# Patient Record
Sex: Male | Born: 1940 | Hispanic: No | Marital: Married | Smoking: Former smoker
Health system: Southern US, Community
[De-identification: ages and names within clinical notes are randomized; demographics above are authoritative.]

## PROBLEM LIST (undated history)

## (undated) DIAGNOSIS — I459 Conduction disorder, unspecified: Secondary | ICD-10-CM

## (undated) DIAGNOSIS — I77819 Aortic ectasia, unspecified site: Secondary | ICD-10-CM

## (undated) DIAGNOSIS — E785 Hyperlipidemia, unspecified: Secondary | ICD-10-CM

## (undated) DIAGNOSIS — R011 Cardiac murmur, unspecified: Secondary | ICD-10-CM

## (undated) DIAGNOSIS — R195 Other fecal abnormalities: Secondary | ICD-10-CM

## (undated) DIAGNOSIS — I480 Paroxysmal atrial fibrillation: Secondary | ICD-10-CM

## (undated) DIAGNOSIS — I4891 Unspecified atrial fibrillation: Secondary | ICD-10-CM

## (undated) DIAGNOSIS — Z85828 Personal history of other malignant neoplasm of skin: Secondary | ICD-10-CM

## (undated) DIAGNOSIS — I499 Cardiac arrhythmia, unspecified: Secondary | ICD-10-CM

## (undated) DIAGNOSIS — I77810 Thoracic aortic ectasia: Secondary | ICD-10-CM

## (undated) DIAGNOSIS — I2510 Atherosclerotic heart disease of native coronary artery without angina pectoris: Secondary | ICD-10-CM

## (undated) HISTORY — DX: Thoracic aortic ectasia: I77.810

## (undated) HISTORY — PX: INSERT / REPLACE / REMOVE PACEMAKER: SUR710

## (undated) HISTORY — DX: Atherosclerotic heart disease of native coronary artery without angina pectoris: I25.10

## (undated) HISTORY — PX: OTHER SURGICAL HISTORY: SHX169

## (undated) HISTORY — DX: Paroxysmal atrial fibrillation: I48.0

## (undated) HISTORY — DX: Conduction disorder, unspecified: I45.9

## (undated) HISTORY — DX: Aortic ectasia, unspecified site: I77.819

## (undated) HISTORY — DX: Hyperlipidemia, unspecified: E78.5

## (undated) HISTORY — DX: Personal history of other malignant neoplasm of skin: Z85.828

---

## 2018-05-26 DIAGNOSIS — Z85828 Personal history of other malignant neoplasm of skin: Secondary | ICD-10-CM

## 2018-05-26 HISTORY — DX: Personal history of other malignant neoplasm of skin: Z85.828

## 2018-08-04 DIAGNOSIS — C4491 Basal cell carcinoma of skin, unspecified: Secondary | ICD-10-CM

## 2018-08-04 HISTORY — DX: Basal cell carcinoma of skin, unspecified: C44.91

## 2018-10-20 DIAGNOSIS — Z85828 Personal history of other malignant neoplasm of skin: Secondary | ICD-10-CM

## 2019-06-21 ENCOUNTER — Other Ambulatory Visit: Payer: Self-pay | Admitting: Family Medicine

## 2019-06-21 DIAGNOSIS — I714 Abdominal aortic aneurysm, without rupture, unspecified: Secondary | ICD-10-CM

## 2019-06-21 DIAGNOSIS — I723 Aneurysm of iliac artery: Secondary | ICD-10-CM

## 2019-06-27 ENCOUNTER — Other Ambulatory Visit: Payer: Self-pay

## 2019-06-27 ENCOUNTER — Ambulatory Visit
Admission: RE | Admit: 2019-06-27 | Discharge: 2019-06-27 | Disposition: A | Discharge status: Home / Self Care | Payer: Medicare Other | Source: Ambulatory Visit | Attending: Family Medicine | Admitting: Family Medicine

## 2019-06-27 DIAGNOSIS — I714 Abdominal aortic aneurysm, without rupture, unspecified: Secondary | ICD-10-CM

## 2019-06-27 DIAGNOSIS — I723 Aneurysm of iliac artery: Secondary | ICD-10-CM | POA: Diagnosis present

## 2020-02-23 ENCOUNTER — Encounter: Payer: Self-pay | Admitting: Dermatology

## 2020-02-23 ENCOUNTER — Other Ambulatory Visit: Payer: Self-pay

## 2020-02-23 ENCOUNTER — Ambulatory Visit (INDEPENDENT_AMBULATORY_CARE_PROVIDER_SITE_OTHER): Payer: Medicare Other | Admitting: Dermatology

## 2020-02-23 DIAGNOSIS — D229 Melanocytic nevi, unspecified: Secondary | ICD-10-CM | POA: Diagnosis not present

## 2020-02-23 DIAGNOSIS — L578 Other skin changes due to chronic exposure to nonionizing radiation: Secondary | ICD-10-CM

## 2020-02-23 DIAGNOSIS — L814 Other melanin hyperpigmentation: Secondary | ICD-10-CM

## 2020-02-23 DIAGNOSIS — Z85828 Personal history of other malignant neoplasm of skin: Secondary | ICD-10-CM

## 2020-02-23 DIAGNOSIS — D1800 Hemangioma unspecified site: Secondary | ICD-10-CM

## 2020-02-23 DIAGNOSIS — L570 Actinic keratosis: Secondary | ICD-10-CM

## 2020-02-23 DIAGNOSIS — L821 Other seborrheic keratosis: Secondary | ICD-10-CM

## 2020-02-23 DIAGNOSIS — Z1283 Encounter for screening for malignant neoplasm of skin: Secondary | ICD-10-CM | POA: Diagnosis not present

## 2020-02-23 NOTE — Progress Notes (Signed)
Follow-Up Visit   Subjective  Todd Lucas is a 79 y.o. male who presents for the following: full body skin exam and skin cancer screening  Patient presents today for full body skin exam and skin cancer screening, has a few areas of concern on his face that he would like to have evaluated today. 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  The following portions of the chart were reviewed this encounter and updated as appropriate:  Tobacco  Allergies  Meds  Problems  Med Hx  Surg Hx  Fam Hx      Review of Systems:  No other skin or systemic complaints except as noted in HPI or Assessment and Plan.  Objective  Well appearing patient in no apparent distress; mood and affect are within normal limits.  A full examination was performed including scalp, head, eyes, ears, nose, lips, neck, chest, axillae, abdomen, back, buttocks, bilateral upper extremities, bilateral lower extremities, hands, feet, fingers, toes, fingernails, and toenails. All findings within normal limits unless otherwise noted below.  Objective  Mid Forehead: Diffuse actinic damage on face including right forehead and right cheek   Assessment & Plan  Actinic skin damage Mid Forehead  Recommend photodynamic therapy for face, ears and side of neck. Reviewed expected reaction and treatment course.   Recommend daily broad spectrum sunscreen SPF 30+ to sun-exposed areas, reapply every 2 hours as needed. Call for new or changing lesions.  Recommend taking Heliocare sun protection supplement daily in sunny weather for additional sun protection. For maximum protection on the sunniest days, you can take up to 2 capsules of regular Heliocare OR take 1 capsule of Heliocare Ultra. For prolonged exposure (such as a full day in the sun), you can repeat your dose of the supplement 4 hours after your first dose. Heliocare can be purchased at [facility] or  at [email or web address].   Recommend Nicotinamide 500mg  twice per day to lower risk of non-melanoma skin cancer by approximately 25%.      Lentigines - Scattered tan macules - Discussed due to sun exposure - Benign, observe - Call for any changes  Seborrheic Keratoses - Stuck-on, waxy, tan-brown papules and plaques  - Discussed benign etiology and prognosis. - Observe - Call for any changes  Melanocytic Nevi - Tan-brown and/or pink-flesh-colored symmetric macules and papules - Benign appearing on exam today - Observation - Call clinic for new or changing moles - Recommend daily use of broad spectrum spf 30+ sunscreen to sun-exposed areas.   Hemangiomas - Red papules - Discussed benign nature - Observe - Call for any changes  History of Basal Cell Carcinoma of the Skin - No evidence of recurrence today - Recommend regular full body skin exams - Recommend daily broad spectrum sunscreen SPF 30+ to sun-exposed areas, reapply every 2 hours as needed.  - Call if any new or changing lesions are noted between office visits  History of Squamous Cell Carcinoma of the Skin - No evidence of recurrence today - No lymphadenopathy - Recommend regular full body skin exams - Recommend daily broad spectrum sunscreen SPF 30+ to sun-exposed areas, reapply every 2 hours as needed.  - Call if any new or changing lesions are noted between office visits  Skin cancer screening performed today.   Return for [facility] light treatment late Sept.,  F/U Dr. Laurence Ferrari in Late October, 6 months TBSE.  I, Donzetta Kohut, CMA, am acting as scribe  for Forest Gleason, MD .  Documentation: I have reviewed the above documentation for accuracy and completeness, and I agree with the above.  Forest Gleason, MD

## 2020-02-23 NOTE — Patient Instructions (Addendum)
Recommend daily broad spectrum sunscreen SPF 30+ to sun-exposed areas, reapply every 2 hours as needed. Call for new or changing lesions.  Recommend taking Heliocare sun protection supplement daily in sunny weather for additional sun protection. For maximum protection on the sunniest days, you can take up to 2 capsules of regular Heliocare OR take 1 capsule of Heliocare Ultra. For prolonged exposure (such as a full day in the sun), you can repeat your dose of the supplement 4 hours after your first dose. Heliocare can be purchased at [facility] or at [email or web address].   Recommend Nicotinamide 500mg  twice per day to lower risk of non-melanoma skin cancer by approximately 25%.   Can also take Heliocare Advanced which contains both Heliocare and nicotinamide together

## 2020-03-12 ENCOUNTER — Encounter: Payer: Self-pay | Admitting: Dermatology

## 2020-04-09 ENCOUNTER — Ambulatory Visit (INDEPENDENT_AMBULATORY_CARE_PROVIDER_SITE_OTHER): Payer: Medicare Other

## 2020-04-09 ENCOUNTER — Other Ambulatory Visit: Payer: Self-pay

## 2020-04-09 DIAGNOSIS — L570 Actinic keratosis: Secondary | ICD-10-CM | POA: Diagnosis not present

## 2020-04-09 MED ORDER — AMINOLEVULINIC ACID HCL 20 % EX SOLR
1.0000 "application " | Freq: Once | CUTANEOUS | Status: AC
  Administered 2020-04-09: 354 mg via TOPICAL

## 2020-04-09 NOTE — Patient Instructions (Signed)

## 2020-04-09 NOTE — Progress Notes (Signed)
Patient completed PDT therapy today.  1. AK (actinic keratosis) face, ears and neck  Photodynamic therapy - face, ears and neck Procedure discussed: discussed risks, benefits, side effects. and alternatives   Prep: site scrubbed/prepped with acetone   Location:  Face, ears and neck Number of lesions:  Multiple Type of treatment:  Blue light Aminolevulinic Acid (see MAR for details): Levulan Number of Levulan sticks used:  1 Incubation time (minutes):  60 Number of minutes under lamp:  16 Number of seconds under lamp:  40 Cooling:  Floor fan Outcome: patient tolerated procedure well with no complications   Post-procedure details: sunscreen applied    Aminolevulinic Acid HCl 20 % SOLR 354 mg - face, ears and neck

## 2020-04-10 ENCOUNTER — Ambulatory Visit: Payer: PRIVATE HEALTH INSURANCE

## 2020-05-09 ENCOUNTER — Other Ambulatory Visit: Payer: Self-pay

## 2020-05-09 ENCOUNTER — Ambulatory Visit (INDEPENDENT_AMBULATORY_CARE_PROVIDER_SITE_OTHER): Payer: Medicare Other | Admitting: Dermatology

## 2020-05-09 ENCOUNTER — Encounter: Payer: Self-pay | Admitting: Dermatology

## 2020-05-09 DIAGNOSIS — L578 Other skin changes due to chronic exposure to nonionizing radiation: Secondary | ICD-10-CM

## 2020-05-09 DIAGNOSIS — L570 Actinic keratosis: Secondary | ICD-10-CM

## 2020-05-09 NOTE — Progress Notes (Signed)
   Follow-Up Visit   Subjective  Todd Lucas is a 79 y.o. male who presents for the following: Follow-up (Pt is following up after PDT to face, neck and ears. ).  He notes significant improvement in the rough spots.  He tolerated the procedure well.  The following portions of the chart were reviewed this encounter and updated as appropriate: Tobacco  Allergies  Meds  Problems  Med Hx  Surg Hx  Fam Hx      Review of Systems: No other skin or systemic complaints except as noted in HPI or Assessment and Plan.   Objective  Well appearing patient in no apparent distress; mood and affect are within normal limits.  A focused examination was performed including face, neck, and ears. Relevant physical exam findings are noted in the Assessment and Plan.   Assessment & Plan  Actinic Damage - Chronic, not at goal - diffuse scaly erythematous macules with underlying dyspigmentation, significantly improved compared to prior but still not at goal - Recommend repeat course of PDT to face ears and neck.  We will plan in January. - Recommend daily broad spectrum sunscreen SPF 30+ to sun-exposed areas, reapply every 2 hours as needed.  - Call for new or changing lesions.  Return in about 3 months (around 08/09/2020) for PDT face, neck, and ears.   I, Harriett Sine, CMA, am acting as scribe for Forest Gleason, MD.  Documentation: I have reviewed the above documentation for accuracy and completeness, and I agree with the above.  Forest Gleason, MD

## 2020-06-01 IMAGING — US US AORTA SCREENING (MEDICARE)
1 series · 14 of 25 positions shown · non-contrast
Comparison: None.

CLINICAL DATA: Evaluate known abdominal aortic and bilateral iliac
artery aneurysms. Former smoker. History of hyperlipidemia.

EXAM:
ULTRASOUND OF ABDOMINAL AORTA
TECHNIQUE: Ultrasound examination of the abdominal aorta and proximal common
iliac arteries was performed to evaluate for aneurysm. Additional
color and Doppler images of the distal aorta were obtained to
document patency.

[Series 1: us aorta screening (medicare) · 14 of 36 slices shown]
[im 1/36]
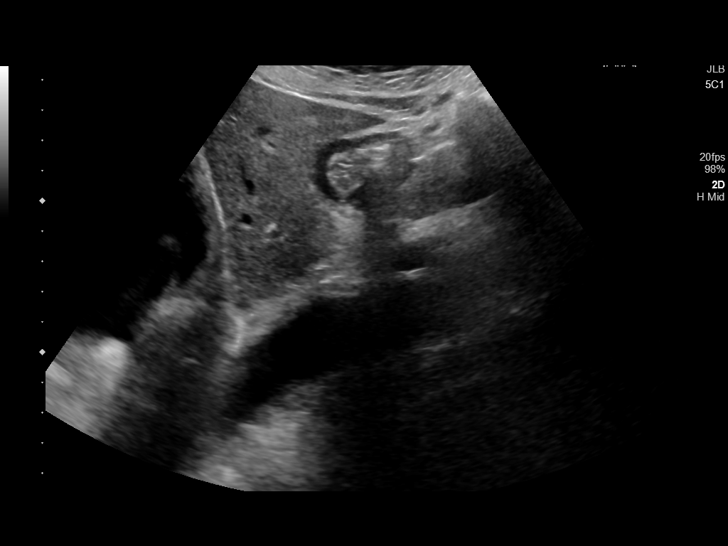
[im 3/36]
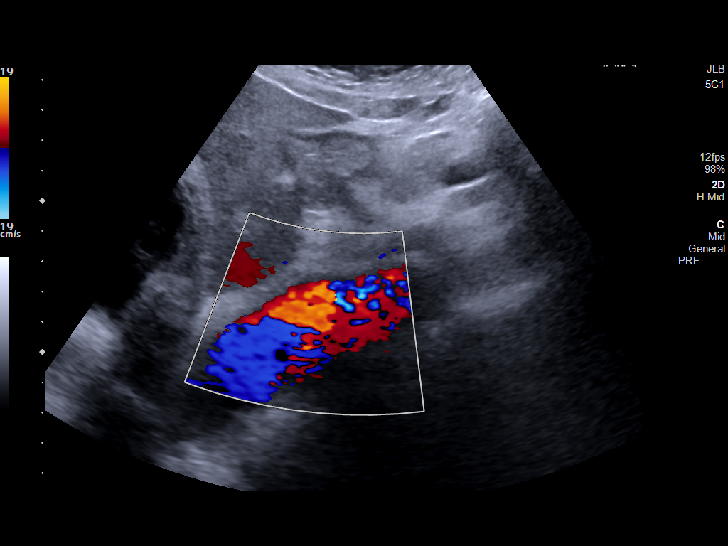
[im 6/36]
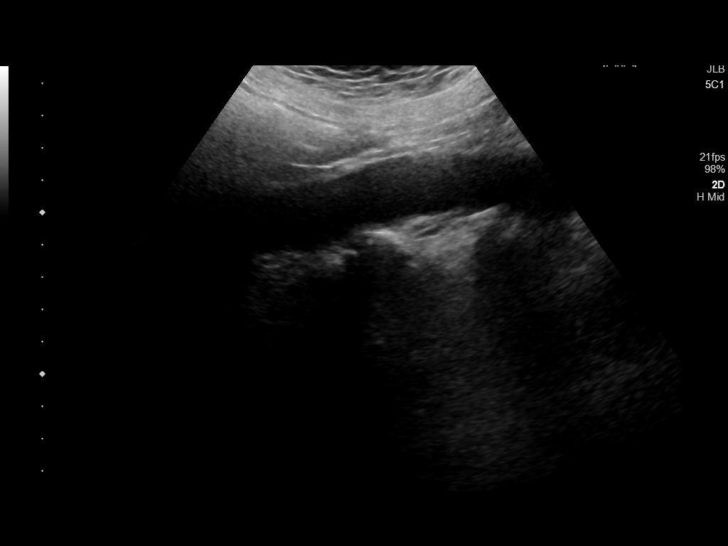
[im 9/36]
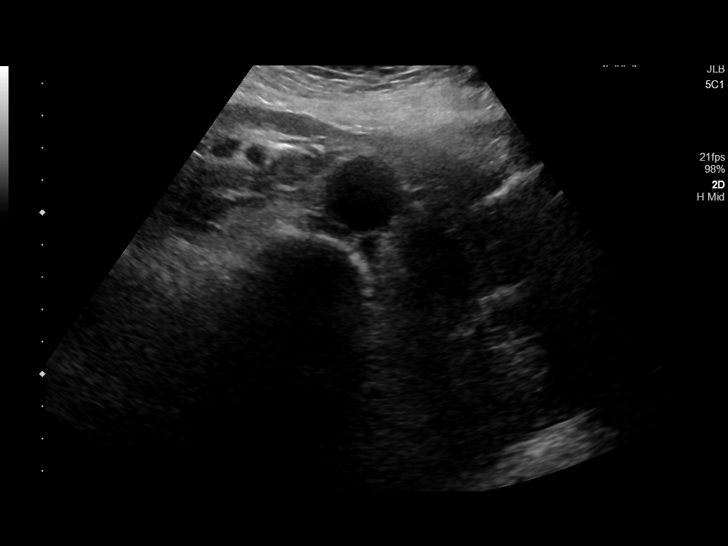
[im 12/36]
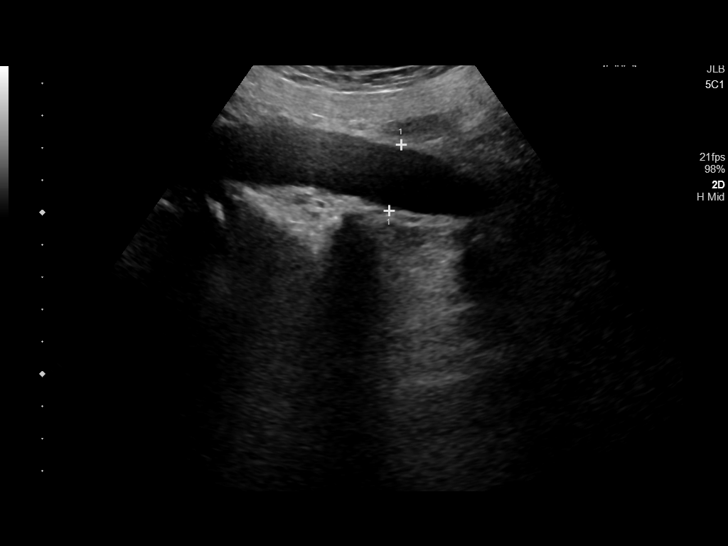
[im 14/36]
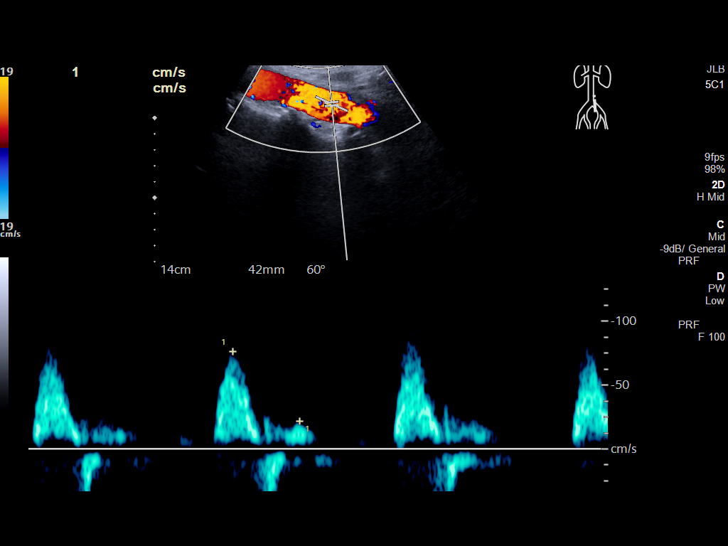
[im 17/36]
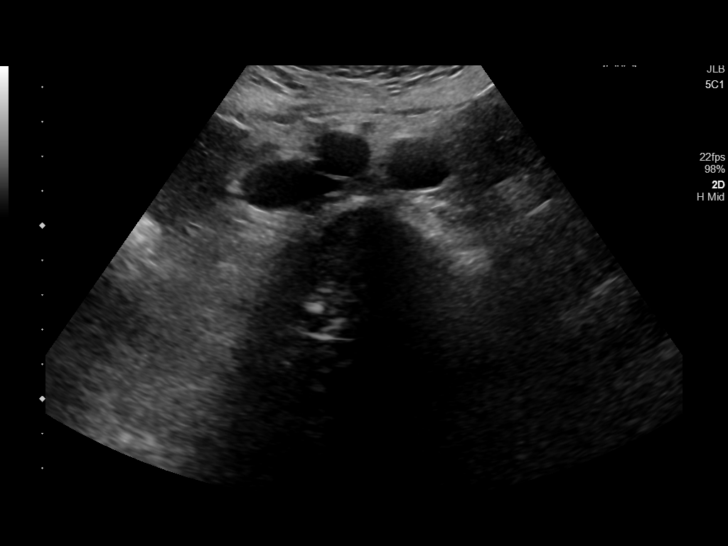
[im 19/36]
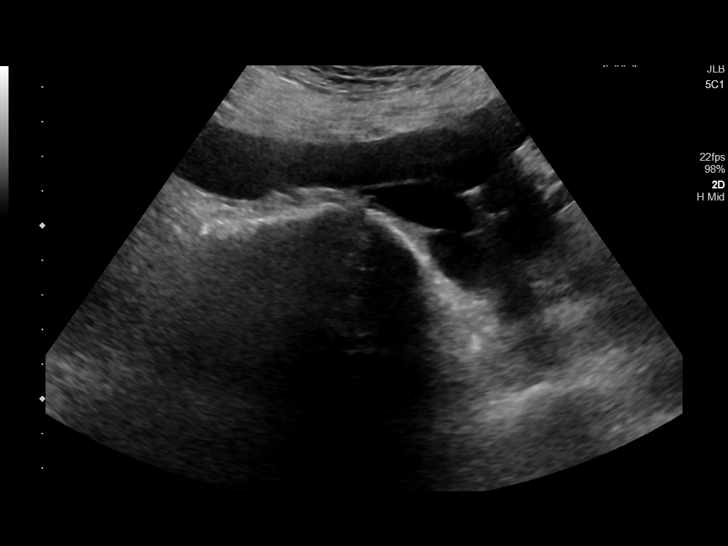
[im 22/36]
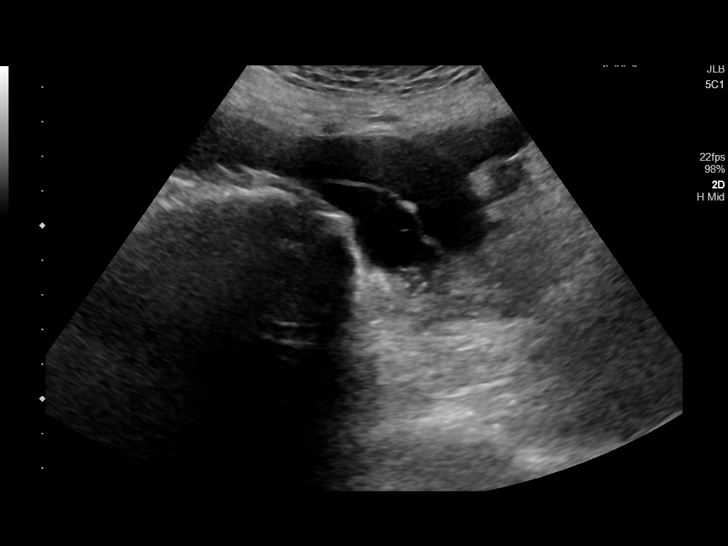
[im 24/36]
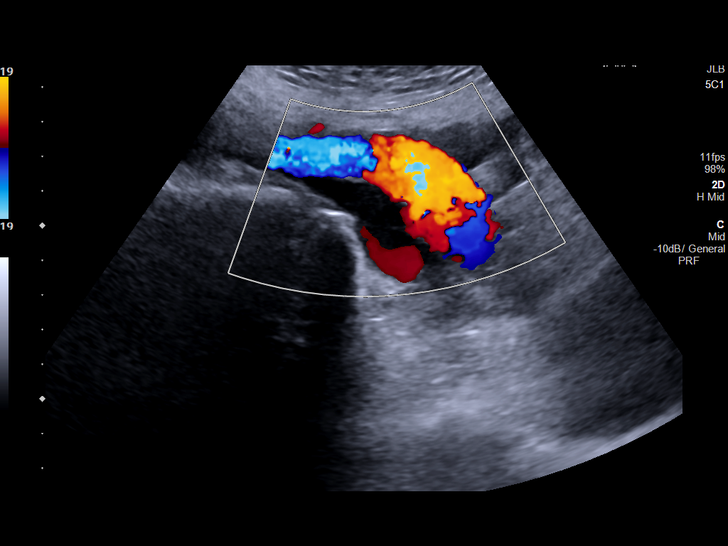
[im 27/36]
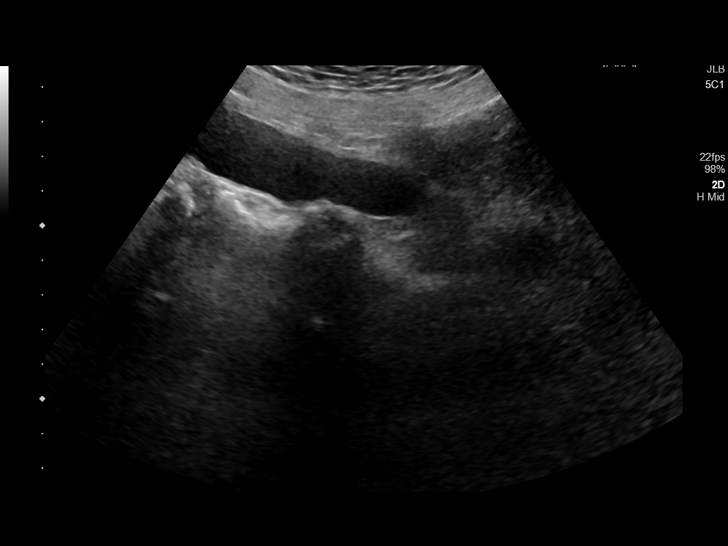
[im 30/36]
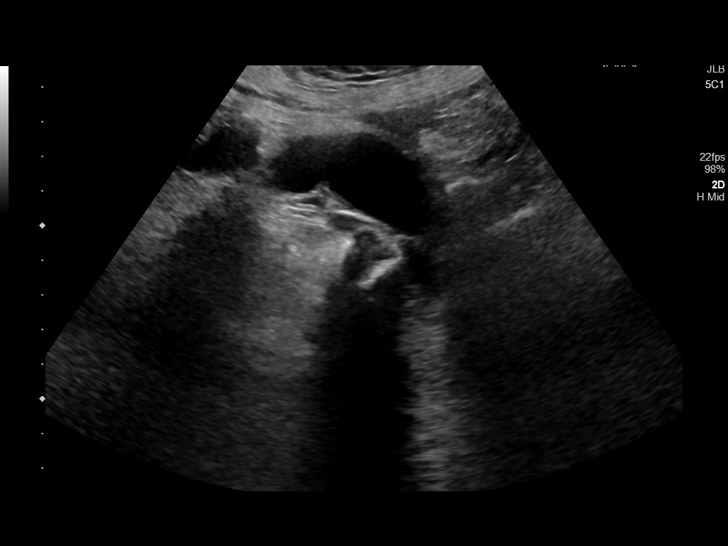
[im 33/36]
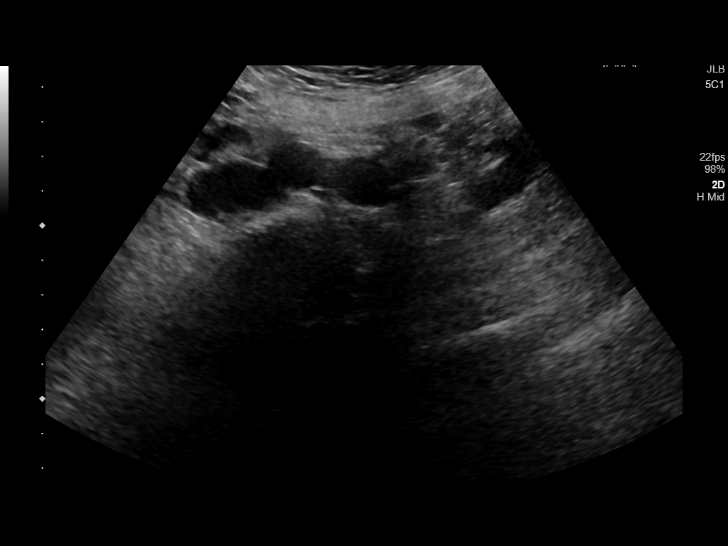
[im 36/36]
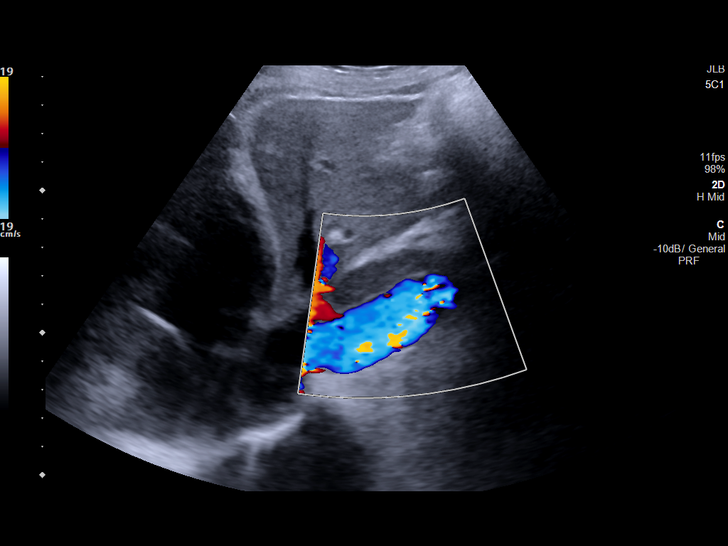

[14 of 25 positions shown; findings below may reference images not displayed]

FINDINGS: Abdominal aortic measurements as follows:

Proximal:  3.0 x 2.4 cm

Mid:  2.4 x 2.3 cm

Distal:  2.1 x 1.9 cm
Patent: Yes, peak systolic velocity is 76 cm/s

Right common iliac artery: 1.9 x 1.6 cm

Left common iliac artery: 2.1 x 1.7 cm
IMPRESSION: 1. No evidence of abdominal aortic aneurysm.
2. Mild ectasia of the bilateral common iliac arteries, left
slightly greater than right.

## 2020-06-14 ENCOUNTER — Other Ambulatory Visit: Payer: Self-pay

## 2020-06-14 ENCOUNTER — Ambulatory Visit (INDEPENDENT_AMBULATORY_CARE_PROVIDER_SITE_OTHER): Payer: Medicare Other

## 2020-06-14 DIAGNOSIS — L570 Actinic keratosis: Secondary | ICD-10-CM | POA: Diagnosis not present

## 2020-06-14 MED ORDER — AMINOLEVULINIC ACID HCL 20 % EX SOLR
1.0000 "application " | Freq: Once | CUTANEOUS | Status: AC
  Administered 2020-06-14: 354 mg via TOPICAL

## 2020-06-14 NOTE — Patient Instructions (Signed)

## 2020-06-14 NOTE — Progress Notes (Signed)
Patient completed PDT therapy today.  1. AK (actinic keratosis) face, neck and ears  Photodynamic therapy - face, neck and ears Procedure discussed: discussed risks, benefits, side effects. and alternatives   Prep: site scrubbed/prepped with acetone   Location:  Face, neck and ears Number of lesions:  Multiple Type of treatment:  Blue light Aminolevulinic Acid (see MAR for details): Levulan Number of Levulan sticks used:  1 Incubation time (minutes):  60 Number of minutes under lamp:  16 Number of seconds under lamp:  40 Cooling:  Floor fan Outcome: patient tolerated procedure well with no complications   Post-procedure details: sunscreen applied    Aminolevulinic Acid HCl 20 % SOLR 354 mg - face, neck and ears

## 2020-08-23 ENCOUNTER — Other Ambulatory Visit: Payer: Self-pay

## 2020-08-23 ENCOUNTER — Ambulatory Visit (INDEPENDENT_AMBULATORY_CARE_PROVIDER_SITE_OTHER): Payer: Medicare Other | Admitting: Dermatology

## 2020-08-23 DIAGNOSIS — L821 Other seborrheic keratosis: Secondary | ICD-10-CM | POA: Diagnosis not present

## 2020-08-23 DIAGNOSIS — Z1283 Encounter for screening for malignant neoplasm of skin: Secondary | ICD-10-CM | POA: Diagnosis not present

## 2020-08-23 DIAGNOSIS — Z85828 Personal history of other malignant neoplasm of skin: Secondary | ICD-10-CM | POA: Diagnosis not present

## 2020-08-23 DIAGNOSIS — L814 Other melanin hyperpigmentation: Secondary | ICD-10-CM | POA: Diagnosis not present

## 2020-08-23 DIAGNOSIS — L570 Actinic keratosis: Secondary | ICD-10-CM | POA: Diagnosis not present

## 2020-08-23 DIAGNOSIS — L578 Other skin changes due to chronic exposure to nonionizing radiation: Secondary | ICD-10-CM

## 2020-08-23 DIAGNOSIS — D229 Melanocytic nevi, unspecified: Secondary | ICD-10-CM

## 2020-08-23 DIAGNOSIS — D1800 Hemangioma unspecified site: Secondary | ICD-10-CM

## 2020-08-23 NOTE — Patient Instructions (Signed)
Cryotherapy Aftercare  . Wash gently with soap and water everyday.   Marland Kitchen Apply Vaseline and Band-Aid daily until healed.  Prior to procedure, discussed risks of blister formation, small wound, skin dyspigmentation, or rare scar following cryotherapy.   Melanoma ABCDEs  Melanoma is the most dangerous type of skin cancer, and is the leading cause of death from skin disease.  You are more likely to develop melanoma if you:  Have light-colored skin, light-colored eyes, or red or blond hair  Spend a lot of time in the sun  Tan regularly, either outdoors or in a tanning bed  Have had blistering sunburns, especially during childhood  Have a close family member who has had a melanoma  Have atypical moles or large birthmarks  Early detection of melanoma is key since treatment is typically straightforward and cure rates are extremely high if we catch it early.   The first sign of melanoma is often a change in a mole or a new dark spot.  The ABCDE system is a way of remembering the signs of melanoma.  A for asymmetry:  The two halves do not match. B for border:  The edges of the growth are irregular. C for color:  A mixture of colors are present instead of an even brown color. D for diameter:  Melanomas are usually (but not always) greater than 95mm - the size of a pencil eraser. E for evolution:  The spot keeps changing in size, shape, and color.  Please check your skin once per month between visits. You can use a small mirror in front and a large mirror behind you to keep an eye on the back side or your body.   If you see any new or changing lesions before your next follow-up, please call to schedule a visit.  Please continue daily skin protection including broad spectrum sunscreen SPF 30+ to sun-exposed areas, reapplying every 2 hours as needed when you're outdoors.

## 2020-08-23 NOTE — Progress Notes (Signed)
Follow-Up Visit   Subjective  Todd Lucas is a 80 y.o. male who presents for the following: TBSE (Patient here for full body skin exam and skin cancer screening. Patient with hx of SCC, BCC and AK's. Nothing new or changing that patient is aware of. )  Patient has had PDT x 2 recently and feels that there may be a few places that were missed.   Patient accompanied by wife.   The following portions of the chart were reviewed this encounter and updated as appropriate:   Tobacco  Allergies  Meds  Problems  Med Hx  Surg Hx  Fam Hx      Review of Systems:  No other skin or systemic complaints except as noted in HPI or Assessment and Plan.  Objective  Well appearing patient in no apparent distress; mood and affect are within normal limits.  A full examination was performed including scalp, head, eyes, ears, nose, lips, neck, chest, axillae, abdomen, back, buttocks, bilateral upper extremities, bilateral lower extremities, hands, feet, fingers, toes, fingernails, and toenails. All findings within normal limits unless otherwise noted below.  Objective  L vertex x 1, R frontal x 1, R vertex x 1, R zygoma x 1, R temple x 1, R medial cheek x 1, L cheek x 1, L temple x 1, L earlobe x 1, R pretibia x 1 (10): Erythematous thin papules/macules with gritty scale.    Assessment & Plan  AK (actinic keratosis) (10) L vertex x 1, R frontal x 1, R vertex x 1, R zygoma x 1, R temple x 1, R medial cheek x 1, L cheek x 1, L temple x 1, L earlobe x 1, R pretibia x 1  Prior to procedure, discussed risks of blister formation, small wound, skin dyspigmentation, or rare scar following cryotherapy.   Hypertrophic at scalp and right pretibia  Destruction of lesion - L vertex x 1, R frontal x 1, R vertex x 1, R zygoma x 1, R temple x 1, R medial cheek x 1, L cheek x 1, L temple x 1, L earlobe x 1, R pretibia x 1 Complexity: simple   Destruction method: cryotherapy   Informed consent: discussed and  consent obtained   Lesion destroyed using liquid nitrogen: Yes   Cryotherapy cycles:  2 Outcome: patient tolerated procedure well with no complications   Post-procedure details: wound care instructions given     Lentigines - Scattered tan macules - Discussed due to sun exposure - Benign, observe - Call for any changes  Seborrheic Keratoses - Stuck-on, waxy, tan-brown papules and plaques  - Discussed benign etiology and prognosis. - Observe - Call for any changes  Melanocytic Nevi - Tan-brown and/or pink-flesh-colored symmetric macules and papules - Benign appearing on exam today - Observation - Call clinic for new or changing moles - Recommend daily use of broad spectrum spf 30+ sunscreen to sun-exposed areas.   Hemangiomas - Red papules - Discussed benign nature - Observe - Call for any changes  Actinic Damage - Improved s/p PDT - Chronic, secondary to cumulative UV/sun exposure - diffuse scaly erythematous macules with underlying dyspigmentation - Recommend daily broad spectrum sunscreen SPF 30+ to sun-exposed areas, reapply every 2 hours as needed.  - Call for new or changing lesions.  Skin cancer screening performed today.  History of Basal Cell Carcinoma of the Skin - No evidence of recurrence today - Recommend regular full body skin exams - Recommend daily broad spectrum sunscreen SPF 30+ to  sun-exposed areas, reapply every 2 hours as needed.  - Call if any new or changing lesions are noted between office visits  History of Squamous Cell Carcinoma of the Skin - No evidence of recurrence today - No lymphadenopathy - Recommend regular full body skin exams - Recommend daily broad spectrum sunscreen SPF 30+ to sun-exposed areas, reapply every 2 hours as needed.  - Call if any new or changing lesions are noted between office visits  Return in about 3 months (around 11/20/2020) for AK follow up.  Graciella Belton, RMA, am acting as scribe for Forest Gleason, MD  .  Documentation: I have reviewed the above documentation for accuracy and completeness, and I agree with the above.  Forest Gleason, MD

## 2020-08-27 ENCOUNTER — Encounter: Payer: Self-pay | Admitting: Dermatology

## 2020-11-01 ENCOUNTER — Other Ambulatory Visit: Payer: Self-pay

## 2020-11-01 ENCOUNTER — Ambulatory Visit (INDEPENDENT_AMBULATORY_CARE_PROVIDER_SITE_OTHER): Payer: Medicare Other | Admitting: Dermatology

## 2020-11-01 DIAGNOSIS — L570 Actinic keratosis: Secondary | ICD-10-CM | POA: Diagnosis not present

## 2020-11-01 DIAGNOSIS — Z85828 Personal history of other malignant neoplasm of skin: Secondary | ICD-10-CM | POA: Diagnosis not present

## 2020-11-01 DIAGNOSIS — L578 Other skin changes due to chronic exposure to nonionizing radiation: Secondary | ICD-10-CM

## 2020-11-01 NOTE — Patient Instructions (Addendum)
Cryotherapy Aftercare  . Wash gently with soap and water everyday.   Marland Kitchen Apply Vaseline and Band-Aid daily until healed.  Prior to procedure, discussed risks of blister formation, small wound, skin dyspigmentation, or rare scar following cryotherapy.   Recommend taking Heliocare sun protection supplement daily in sunny weather for additional sun protection. For maximum protection on the sunniest days, you can take up to 2 capsules of regular Heliocare OR take 1 capsule of Heliocare Ultra. For prolonged exposure (such as a full day in the sun), you can repeat your dose of the supplement 4 hours after your first dose. Heliocare can be purchased at [facility] or at [email or web address].   Recommend daily broad spectrum sunscreen SPF 30+ to sun-exposed areas, reapply every 2 hours as needed. Call for new or changing lesions.  Staying in the shade or wearing long sleeves, sun glasses (UVA+UVB protection) and wide brim hats (4-inch brim around the entire circumference of the hat) are also recommended for sun protection.   If you have any questions or concerns for your doctor, please call our main line at [phone] and press option 4 to reach your doctor's medical assistant. If no one answers, please leave a voicemail as directed and we will return your call as soon as possible. Messages left after 4 pm will be answered the following business day.   You may also send [place] a message via [place]. We typically respond to MyChart messages within 1-2 business days.  For prescription refills, please ask your pharmacy to contact our office. Our fax number is [phone].  If you have an urgent issue when the clinic is closed that cannot wait until the next business day, you can page your doctor at the number below.    Please note that while we do our best to be available for urgent issues outside of office hours, we are not available 24/7.   If you have an urgent issue and are unable to reach [place], you  may choose to seek medical care at your doctor's office, retail clinic, urgent care center, or emergency room.  If you have a medical emergency, please immediately call 911 or go to the emergency department.  Pager Numbers  - Dr. Nehemiah Massed: [phone]  - Dr. Laurence Ferrari: [phone]  - Dr. Nicole Kindred: [phone]  In the event of inclement weather, please call our main line at [phone] for an update on the status of any delays or closures.  Dermatology Medication Tips: Please keep the boxes that topical medications come in in order to help keep track of the instructions about where and how to use these. Pharmacies typically print the medication instructions only on the boxes and not directly on the medication tubes.   If your medication is too expensive, please contact our office at [phone] option 4 or send [place] a message through [place].   We are unable to tell what your co-pay for medications will be in advance as this is different depending on your insurance coverage. However, we may be able to find a substitute medication at lower cost or fill out paperwork to get insurance to cover a needed medication.   If a prior authorization is required to get your medication covered by your insurance company, please allow [place] 1-2 business days to complete this process.  Drug prices often vary depending on where the prescription is filled and some pharmacies may offer cheaper prices.  The website www.goodrx.com contains coupons for medications through different pharmacies. The prices here do not  account for what the cost may be with help from insurance (it may be cheaper with your insurance), but the website can give you the price if you did not use any insurance.  - You can print the associated coupon and take it with your prescription to the pharmacy.  - You may also stop by our office during regular business hours and pick up a GoodRx coupon card.  - If you need your prescription sent  electronically to a different pharmacy, notify our office through [facility] or by phone at [phone] option 4.  Recommend Nicotinamide or Niacinamide 500mg  twice per day to lower risk of non-melanoma skin cancer by approximately 25%. Available at [employer].

## 2020-11-01 NOTE — Progress Notes (Signed)
   Follow-Up Visit   Subjective  Todd Lucas is a 80 y.o. male who presents for the following: Follow-up (Patient here today for 2 month AK follow up. Patient had PDT in September and December to face, neck and ears. Patient had 10 AK's treated with LN2 at last visit. He does have a scaly spot at right scalp and a rough spot at right cheek.).   Patient's TBSE was in February and he does have a hx of BCC and SCC.   The following portions of the chart were reviewed this encounter and updated as appropriate:   Tobacco  Allergies  Meds  Problems  Med Hx  Surg Hx  Fam Hx      Review of Systems:  No other skin or systemic complaints except as noted in HPI or Assessment and Plan.  Objective  Well appearing patient in no apparent distress; mood and affect are within normal limits.  A focused examination was performed including hands, arms, face, neck, ears and scalp. Relevant physical exam findings are noted in the Assessment and Plan.  Objective  Right Vertex Scalp x 1, right inf cheek medial to eyelid x 1, left nasal tip x 1, left lower lateral eyelid x 1 (4): Erythematous thin papules/macules with gritty scale.    Assessment & Plan  AK (actinic keratosis) (4) Right Vertex Scalp x 1, right inf cheek medial to eyelid x 1, left nasal tip x 1, left lower lateral eyelid x 1  Prior to procedure, discussed risks of blister formation, small wound, skin dyspigmentation, or rare scar following cryotherapy.   Hypertrophic at right vertex scalp and right inf cheek. Consider bx if not resolved at right vertex scalp.  Destruction of lesion - Right Vertex Scalp x 1, right inf cheek medial to eyelid x 1, left nasal tip x 1, left lower lateral eyelid x 1  Destruction method: cryotherapy   Informed consent: discussed and consent obtained   Lesion destroyed using liquid nitrogen: Yes   Cryotherapy cycles:  2 Outcome: patient tolerated procedure well with no complications   Post-procedure  details: wound care instructions given    Actinic Damage - chronic, secondary to cumulative UV radiation exposure/sun exposure over time - diffuse scaly erythematous macules with underlying dyspigmentation - Recommend daily broad spectrum sunscreen SPF 30+ to sun-exposed areas, reapply every 2 hours as needed.  - Recommend staying in the shade or wearing long sleeves, sun glasses (UVA+UVB protection) and wide brim hats (4-inch brim around the entire circumference of the hat). - Call for new or changing lesions.  History of BCC and SCC - Recommend Nicotinamide 500mg  twice per day to lower risk of non-melanoma skin cancer by approximately 25%.  - Recommend taking Heliocare sun protection supplement daily in sunny weather for additional sun protection. For maximum protection on the sunniest days, you can take up to 2 capsules of regular Heliocare OR take 1 capsule of Heliocare Ultra. For prolonged exposure (such as a full day in the sun), you can repeat your dose of the supplement 4 hours after your first dose.   Return in about 2 months (around 01/01/2021) for AK follow up, 4 month TBSE.  Graciella Belton, RMA, am acting as scribe for Forest Gleason, MD .  Documentation: I have reviewed the above documentation for accuracy and completeness, and I agree with the above.  Forest Gleason, MD

## 2020-11-05 ENCOUNTER — Encounter: Payer: Self-pay | Admitting: Dermatology

## 2021-01-09 ENCOUNTER — Encounter: Payer: Self-pay | Admitting: Dermatology

## 2021-01-09 ENCOUNTER — Other Ambulatory Visit: Payer: Self-pay

## 2021-01-09 ENCOUNTER — Ambulatory Visit (INDEPENDENT_AMBULATORY_CARE_PROVIDER_SITE_OTHER): Payer: Medicare Other | Admitting: Dermatology

## 2021-01-09 DIAGNOSIS — L821 Other seborrheic keratosis: Secondary | ICD-10-CM

## 2021-01-09 DIAGNOSIS — L578 Other skin changes due to chronic exposure to nonionizing radiation: Secondary | ICD-10-CM | POA: Diagnosis not present

## 2021-01-09 DIAGNOSIS — L570 Actinic keratosis: Secondary | ICD-10-CM

## 2021-01-09 NOTE — Progress Notes (Signed)
Follow-Up Visit   Subjective  Todd Lucas is a 80 y.o. male who presents for the following: Follow-up (Patient here today for 2 month AK follow up. Patient thinks spots have resolved and is not aware of any new or changing spots. ).  Areas treated at last visit with LN2 at Right Vertex Scalp x 1, right inf cheek medial to eyelid x 1, left nasal tip x 1, left lower lateral eyelid x 1  The following portions of the chart were reviewed this encounter and updated as appropriate:   Tobacco  Allergies  Meds  Problems  Med Hx  Surg Hx  Fam Hx       Review of Systems:  No other skin or systemic complaints except as noted in HPI or Assessment and Plan.  Objective  Well appearing patient in no apparent distress; mood and affect are within normal limits.  A focused examination was performed including face, neck and scalp. Relevant physical exam findings are noted in the Assessment and Plan.  left cheek x 2, chin x 1, right temple x 1, left postauricular neck x 1, left nasal sidewall x 1, right cheek x 3, right zygoma x 1 (10) Erythematous thin papules/macules with gritty scale.    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 Recommend Vaseline ointment to treated areas while healing.  Actinic keratoses are precancerous spots that appear secondary to cumulative UV radiation exposure/sun exposure over time. They are chronic with expected duration over 1 year. A portion of actinic keratoses will progress to squamous cell carcinoma of the skin. It is not possible to reliably predict which spots will progress to skin cancer and so treatment is recommended to prevent development of skin cancer.  Recommend daily broad spectrum  sunscreen SPF 30+ to sun-exposed areas, reapply every 2 hours as needed.  Recommend staying in the shade or wearing long sleeves, sun glasses (UVA+UVB protection) and wide brim hats (4-inch brim around the entire circumference of the hat). Call for new or changing lesions.    Destruction of lesion - left cheek x 2, chin x 1, right temple x 1, left postauricular neck x 1, left nasal sidewall x 1, right cheek x 3, right zygoma x 1  Destruction method: cryotherapy   Informed consent: discussed and consent obtained   Lesion destroyed using liquid nitrogen: Yes   Cryotherapy cycles:  2 Outcome: patient tolerated procedure well with no complications   Post-procedure details: wound care instructions given    Actinic Damage - chronic, secondary to cumulative UV radiation exposure/sun exposure over time - diffuse scaly erythematous macules with underlying dyspigmentation - Recommend daily broad spectrum sunscreen SPF 30+ to sun-exposed areas, reapply every 2 hours as needed.  - Recommend staying in the shade or wearing long sleeves, sun glasses (UVA+UVB protection) and wide brim hats (4-inch brim around the entire circumference of the hat). - Call for new or changing lesions. 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 - Observe - Call for any changes    Return for TBSE, as scheduled.  Graciella Belton, RMA, am acting as scribe  for Forest Gleason, MD .  Documentation: I have reviewed the above documentation for accuracy and completeness, and I agree with the above.  Forest Gleason, MD

## 2021-01-09 NOTE — Patient Instructions (Addendum)
Cryotherapy Aftercare  Wash gently with soap and water everyday.   Apply Vaseline and Band-Aid daily until healed.   Prior to procedure, discussed risks of blister formation, small wound, skin dyspigmentation, or rare scar following cryotherapy. Recommend Vaseline ointment to treated areas while healing.  Recommend daily broad spectrum sunscreen SPF 30+ to sun-exposed areas, reapply every 2 hours as needed. Call for new or changing lesions.  Staying in the shade or wearing long sleeves, sun glasses (UVA+UVB protection) and wide brim hats (4-inch brim around the entire circumference of the hat) are also recommended for sun protection.   Recommend taking Heliocare sun protection supplement daily in sunny weather for additional sun protection. For maximum protection on the sunniest days, you can take up to 2 capsules of regular Heliocare OR take 1 capsule of Heliocare Ultra. For prolonged exposure (such as a full day in the sun), you can repeat your dose of the supplement 4 hours after your first dose. Heliocare can be purchased at [facility] or at [email or web address].    If you have any questions or concerns for your doctor, please call our main line at [phone] and press option 4 to reach your doctor's medical assistant. If no one answers, please leave a voicemail as directed and we will return your call as soon as possible. Messages left after 4 pm will be answered the following business day.   You may also send us a message via MyChart. We typically respond to MyChart messages within 1-2 business days.  For prescription refills, please ask your pharmacy to contact our office. Our fax number is [phone].  If you have an urgent issue when the clinic is closed that cannot wait until the next business day, you can page your doctor at the number below.    Please note that while we do our best to be available for urgent issues outside of office hours, we are not available 24/7.   If  you have an urgent issue and are unable to reach us, you may choose to seek medical care at your doctor's office, retail clinic, urgent care center, or emergency room.  If you have a medical emergency, please immediately call 911 or go to the emergency department.  Pager Numbers  - Dr. Kowalski: [phone]  - Dr. Moye: [phone]  - Dr. Stewart: [phone]  In the event of inclement weather, please call our main line at [phone] for an update on the status of any delays or closures.  Dermatology Medication Tips: Please keep the boxes that topical medications come in in order to help keep track of the instructions about where and how to use these. Pharmacies typically print the medication instructions only on the boxes and not directly on the medication tubes.   If your medication is too expensive, please contact our office at [phone] option 4 or send us a message through MyChart.   We are unable to tell what your co-pay for medications will be in advance as this is different depending on your insurance coverage. However, we may be able to find a substitute medication at lower cost or fill out paperwork to get insurance to cover a needed medication.   If a prior authorization is required to get your medication covered by your insurance company, please allow us 1-2 business days to complete this process.  Drug prices often vary depending on where the prescription is filled and some pharmacies may offer cheaper prices.  The website www.goodrx.com contains coupons for medications through   different pharmacies. The prices here do not account for what the cost may be with help from insurance (it may be cheaper with your insurance), but the website can give you the price if you did not use any insurance.  - You can print the associated coupon and take it with your prescription to the pharmacy.  - You may also stop by our office during regular business hours and pick up a GoodRx  coupon card.  - If you need your prescription sent electronically to a different pharmacy, notify our office through [facility] MyChart or by phone at [phone] option 4.  

## 2021-02-21 ENCOUNTER — Ambulatory Visit: Payer: PRIVATE HEALTH INSURANCE | Admitting: Dermatology

## 2021-04-11 ENCOUNTER — Encounter: Payer: Self-pay | Admitting: Dermatology

## 2021-04-11 ENCOUNTER — Ambulatory Visit (INDEPENDENT_AMBULATORY_CARE_PROVIDER_SITE_OTHER): Payer: Medicare Other | Admitting: Dermatology

## 2021-04-11 ENCOUNTER — Ambulatory Visit (INDEPENDENT_AMBULATORY_CARE_PROVIDER_SITE_OTHER): Payer: Medicare Other

## 2021-04-11 ENCOUNTER — Other Ambulatory Visit: Payer: Self-pay

## 2021-04-11 DIAGNOSIS — L578 Other skin changes due to chronic exposure to nonionizing radiation: Secondary | ICD-10-CM | POA: Diagnosis not present

## 2021-04-11 DIAGNOSIS — D1800 Hemangioma unspecified site: Secondary | ICD-10-CM

## 2021-04-11 DIAGNOSIS — L570 Actinic keratosis: Secondary | ICD-10-CM | POA: Diagnosis not present

## 2021-04-11 DIAGNOSIS — Z85828 Personal history of other malignant neoplasm of skin: Secondary | ICD-10-CM

## 2021-04-11 DIAGNOSIS — L821 Other seborrheic keratosis: Secondary | ICD-10-CM

## 2021-04-11 DIAGNOSIS — L814 Other melanin hyperpigmentation: Secondary | ICD-10-CM

## 2021-04-11 DIAGNOSIS — Z1283 Encounter for screening for malignant neoplasm of skin: Secondary | ICD-10-CM | POA: Diagnosis not present

## 2021-04-11 DIAGNOSIS — Z872 Personal history of diseases of the skin and subcutaneous tissue: Secondary | ICD-10-CM

## 2021-04-11 DIAGNOSIS — D229 Melanocytic nevi, unspecified: Secondary | ICD-10-CM

## 2021-04-11 MED ORDER — AMINOLEVULINIC ACID HCL 20 % EX SOLR
1.0000 "application " | Freq: Once | CUTANEOUS | Status: AC
  Administered 2021-04-11: 354 mg via TOPICAL

## 2021-04-11 NOTE — Progress Notes (Signed)
Patient completed PDT therapy today.  1. AK (actinic keratosis) Head - Anterior (Face)  Photodynamic therapy - Head - Anterior (Face) Procedure discussed: discussed risks, benefits, side effects. and alternatives   Prep: site scrubbed/prepped with acetone   Location:  Face Number of lesions:  Multiple Type of treatment:  Blue light Aminolevulinic Acid (see MAR for details): Levulan Number of Levulan sticks used:  1 Incubation time (minutes):  60 Number of minutes under lamp:  16 Number of seconds under lamp:  40 Cooling:  Floor fan Outcome: patient tolerated procedure well with no complications   Post-procedure details: sunscreen applied     

## 2021-04-11 NOTE — Progress Notes (Signed)
Follow-Up Visit   Subjective  Todd Lucas is a 80 y.o. male who presents for the following: FBSE (Patient here for full body skin exam and skin cancer screening. Patient with hx of SCC, BCC and AK's. He is not aware of any new or changing spots. ).   The following portions of the chart were reviewed this encounter and updated as appropriate:   Tobacco  Allergies  Meds  Problems  Med Hx  Surg Hx  Fam Hx      Review of Systems:  No other skin or systemic complaints except as noted in HPI or Assessment and Plan.  Objective  Well appearing patient in no apparent distress; mood and affect are within normal limits.  A full examination was performed including scalp, head, eyes, ears, nose, lips, neck, chest, axillae, abdomen, back, buttocks, bilateral upper extremities, bilateral lower extremities, hands, feet, fingers, toes, fingernails, and toenails. All findings within normal limits unless otherwise noted below.  left pretibia x 1, left neck x 1 (2) Erythematous thin papules/macules with gritty scale.      Assessment & Plan  AK (actinic keratosis) (2) left pretibia x 1, left neck x 1  Hypertrophic at left pretibia  Prior to procedure, discussed risks of blister formation, small wound, skin dyspigmentation, or rare scar following cryotherapy. Recommend Vaseline ointment to treated areas while healing.   Destruction of lesion - left pretibia x 1, left neck x 1  Destruction method: cryotherapy   Informed consent: discussed and consent obtained   Lesion destroyed using liquid nitrogen: Yes   Cryotherapy cycles:  2 Outcome: patient tolerated procedure well with no complications   Post-procedure details: wound care instructions given    Lentigines - Scattered tan macules - Due to sun exposure - Benign-appearing, observe - Recommend daily broad spectrum sunscreen SPF 30+ to sun-exposed areas, reapply every 2 hours as needed. - Call for any changes  Seborrheic  Keratoses - Stuck-on, waxy, tan-brown papules and/or plaques  - Benign-appearing - Discussed benign etiology and prognosis. - Observe - Call for any changes  Melanocytic Nevi - Tan-brown and/or pink-flesh-colored symmetric macules and papules - Benign appearing on exam today - Observation - Call clinic for new or changing moles - Recommend daily use of broad spectrum spf 30+ sunscreen to sun-exposed areas.   Hemangiomas - Red papules - Discussed benign nature - Observe - Call for any changes  Actinic Damage - Severe, confluent actinic changes with pre-cancerous actinic keratoses  - Severe, chronic, not at goal, secondary to cumulative UV radiation exposure over time - diffuse scaly erythematous macules and papules with underlying dyspigmentation - Discussed Prescription "Field Treatment" for Severe, Chronic Confluent Actinic Changes with Pre-Cancerous Actinic Keratoses at face and anterior ears Field treatment involves treatment of an entire area of skin that has confluent Actinic Changes (Sun/ Ultraviolet light damage) and PreCancerous Actinic Keratoses by method of PhotoDynamic Therapy (PDT) and/or prescription Topical Chemotherapy agents such as 5-fluorouracil, 5-fluorouracil/calcipotriene, and/or imiquimod.  The purpose is to decrease the number of clinically evident and subclinical PreCancerous lesions to prevent progression to development of skin cancer by chemically destroying early precancer changes that may or may not be visible.  It has been shown to reduce the risk of developing skin cancer in the treated area. As a result of treatment, redness, scaling, crusting, and open sores may occur during treatment course. One or more than one of these methods may be used and may have to be used several times to control, suppress  and eliminate the PreCancerous changes. Discussed treatment course, expected reaction, and possible side effects. - Recommend daily broad spectrum sunscreen SPF  30+ to sun-exposed areas, reapply every 2 hours as needed.  - Staying in the shade or wearing long sleeves, sun glasses (UVA+UVB protection) and wide brim hats (4-inch brim around the entire circumference of the hat) are also recommended. - Call for new or changing lesions. - repeat PDT to face   Skin cancer screening performed today.  History of Basal Cell Carcinoma of the Skin - No evidence of recurrence today - Recommend regular full body skin exams - Recommend daily broad spectrum sunscreen SPF 30+ to sun-exposed areas, reapply every 2 hours as needed.  - Call if any new or changing lesions are noted between office visits  History of Squamous Cell Carcinoma of the Skin - No evidence of recurrence today - No lymphadenopathy - Recommend regular full body skin exams - Recommend daily broad spectrum sunscreen SPF 30+ to sun-exposed areas, reapply every 2 hours as needed.  - Call if any new or changing lesions are noted between office visits  History of PreCancerous Actinic Keratosis  - site(s) of PreCancerous Actinic Keratosis clear today. - these may recur and new lesions may form requiring treatment to prevent transformation into skin cancer - observe for new or changing spots and contact [facility] for appointment if occur - photoprotection with sun protective clothing; sunglasses and broad spectrum sunscreen with SPF of at least 30 + and frequent self skin exams recommended - yearly exams by a dermatologist recommended for persons with history of PreCancerous Actinic Keratoses  Return for Dec/Jan for AK follow up, early March for FBSE.  Graciella Belton, RMA, am acting as scribe for Forest Gleason, MD .  Documentation: I have reviewed the above documentation for accuracy and completeness, and I agree with the above.  Forest Gleason, MD

## 2021-04-11 NOTE — Patient Instructions (Addendum)
Levulan/PDT Treatment Common Side Effects  - Burning/stinging, which may be severe and last up to 24-72 hours after your treatment  - Redness, swelling and/or peeling which may last up to 4 weeks  - Scaling/crusting which may last up to 2 weeks  - Sun sensitivity (you MUST avoid sun exposure for 48-72 hours after treatment)  Care Instructions  - Okay to wash with soap and water and shampoo as normal  - If needed, you can do a cold compress (ex. Ice packs) for comfort  - If okay with your Primary Doctor, you may use analgesics such as Tylenol every 4-6 hours, not to exceed recommended dose  - You may apply Cerave Healing Ointment, Vaseline or Aquaphor  - If you have a lot of swelling you may take a Benadryl to help with this (this may cause drowsiness)  Sun Precautions  - Wear a wide brim hat for the next week if outside  - Wear a sunblock with zinc or titanium dioxide at least SPF 50 daily   We will recheck you in 10-12 weeks. If any problems, please call the office and ask to speak with a nurse.  Cryotherapy Aftercare  Wash gently with soap and water everyday.   Apply Vaseline and Band-Aid daily until healed.   Prior to procedure, discussed risks of blister formation, small wound, skin dyspigmentation, or rare scar following cryotherapy. Recommend Vaseline ointment to treated areas while healing.  Melanoma ABCDEs  Melanoma is the most dangerous type of skin cancer, and is the leading cause of death from skin disease.  You are more likely to develop melanoma if you: Have light-colored skin, light-colored eyes, or red or blond hair Spend a lot of time in the sun Tan regularly, either outdoors or in a tanning bed Have had blistering sunburns, especially during childhood Have a close family member who has had a melanoma Have atypical moles or large birthmarks  Early detection of melanoma is key since treatment is typically straightforward and cure rates are extremely high  if we catch it early.   The first sign of melanoma is often a change in a mole or a new dark spot.  The ABCDE system is a way of remembering the signs of melanoma.  A for asymmetry:  The two halves do not match. B for border:  The edges of the growth are irregular. C for color:  A mixture of colors are present instead of an even brown color. D for diameter:  Melanomas are usually (but not always) greater than 38mm - the size of a pencil eraser. E for evolution:  The spot keeps changing in size, shape, and color.  Please check your skin once per month between visits. You can use a small mirror in front and a large mirror behind you to keep an eye on the back side or your body.   If you see any new or changing lesions before your next follow-up, please call to schedule a visit.  Please continue daily skin protection including broad spectrum sunscreen SPF 30+ to sun-exposed areas, reapplying every 2 hours as needed when you're outdoors.    If you have any questions or concerns for your doctor, please call our main line at [phone] and press option 4 to reach your doctor's medical assistant. If no one answers, please leave a voicemail as directed and we will return your call as soon as possible. Messages left after 4 pm will be answered the following business day.   You  may also send [place] a message via [place]. We typically respond to MyChart messages within 1-2 business days.  For prescription refills, please ask your pharmacy to contact our office. Our fax number is [phone].  If you have an urgent issue when the clinic is closed that cannot wait until the next business day, you can page your doctor at the number below.    Please note that while we do our best to be available for urgent issues outside of office hours, we are not available 24/7.   If you have an urgent issue and are unable to reach [place], you may choose to seek medical care at your doctor's office, retail clinic, urgent care  center, or emergency room.  If you have a medical emergency, please immediately call 911 or go to the emergency department.  Pager Numbers  - Dr. Nehemiah Massed: [phone]  - Dr. Laurence Ferrari: [phone]  - Dr. Nicole Kindred: [phone]  In the event of inclement weather, please call our main line at [phone] for an update on the status of any delays or closures.  Dermatology Medication Tips: Please keep the boxes that topical medications come in in order to help keep track of the instructions about where and how to use these. Pharmacies typically print the medication instructions only on the boxes and not directly on the medication tubes.   If your medication is too expensive, please contact our office at [phone] option 4 or send [place] a message through [place].   We are unable to tell what your co-pay for medications will be in advance as this is different depending on your insurance coverage. However, we may be able to find a substitute medication at lower cost or fill out paperwork to get insurance to cover a needed medication.   If a prior authorization is required to get your medication covered by your insurance company, please allow [place] 1-2 business days to complete this process.  Drug prices often vary depending on where the prescription is filled and some pharmacies may offer cheaper prices.  The website www.goodrx.com contains coupons for medications through different pharmacies. The prices here do not account for what the cost may be with help from insurance (it may be cheaper with your insurance), but the website can give you the price if you did not use any insurance.  - You can print the associated coupon and take it with your prescription to the pharmacy.  - You may also stop by our office during regular business hours and pick up a GoodRx coupon card.  - If you need your prescription sent electronically to a different pharmacy, notify our office through [facility] or by  phone at [phone] option 4.

## 2021-09-05 ENCOUNTER — Ambulatory Visit: Payer: PRIVATE HEALTH INSURANCE | Admitting: Dermatology

## 2021-09-17 ENCOUNTER — Telehealth: Payer: Self-pay

## 2021-09-17 NOTE — Telephone Encounter (Signed)
LVM for pt to call office to reschedule appt with Dr. Laurence Ferrari. ?Todd Lucas., RMA ?

## 2021-09-19 ENCOUNTER — Ambulatory Visit: Payer: PRIVATE HEALTH INSURANCE | Admitting: Dermatology

## 2021-09-27 ENCOUNTER — Encounter: Payer: Self-pay | Admitting: *Deleted

## 2021-09-30 ENCOUNTER — Encounter
Admission: RE | Disposition: A | Discharge status: Home / Self Care | Payer: Self-pay | Source: Home / Self Care | Attending: Gastroenterology

## 2021-09-30 ENCOUNTER — Ambulatory Visit: Payer: Medicare Other | Admitting: Certified Registered Nurse Anesthetist

## 2021-09-30 ENCOUNTER — Encounter: Payer: Self-pay | Admitting: *Deleted

## 2021-09-30 ENCOUNTER — Ambulatory Visit
Admission: RE | Admit: 2021-09-30 | Discharge: 2021-09-30 | Disposition: A | Discharge status: Home / Self Care | Payer: Medicare Other | Attending: Gastroenterology | Admitting: Gastroenterology

## 2021-09-30 ENCOUNTER — Other Ambulatory Visit: Payer: Self-pay

## 2021-09-30 DIAGNOSIS — Z85828 Personal history of other malignant neoplasm of skin: Secondary | ICD-10-CM | POA: Diagnosis not present

## 2021-09-30 DIAGNOSIS — K529 Noninfective gastroenteritis and colitis, unspecified: Secondary | ICD-10-CM | POA: Diagnosis present

## 2021-09-30 DIAGNOSIS — K640 First degree hemorrhoids: Secondary | ICD-10-CM | POA: Diagnosis not present

## 2021-09-30 DIAGNOSIS — I4891 Unspecified atrial fibrillation: Secondary | ICD-10-CM | POA: Diagnosis not present

## 2021-09-30 DIAGNOSIS — Z950 Presence of cardiac pacemaker: Secondary | ICD-10-CM | POA: Diagnosis not present

## 2021-09-30 DIAGNOSIS — Z87891 Personal history of nicotine dependence: Secondary | ICD-10-CM | POA: Diagnosis not present

## 2021-09-30 HISTORY — DX: Other fecal abnormalities: R19.5

## 2021-09-30 HISTORY — DX: Cardiac murmur, unspecified: R01.1

## 2021-09-30 HISTORY — DX: Cardiac arrhythmia, unspecified: I49.9

## 2021-09-30 HISTORY — DX: Unspecified atrial fibrillation: I48.91

## 2021-09-30 HISTORY — PX: COLONOSCOPY WITH PROPOFOL: SHX5780

## 2021-09-30 SURGERY — COLONOSCOPY WITH PROPOFOL
Anesthesia: General

## 2021-09-30 MED ORDER — PROPOFOL 10 MG/ML IV BOLUS
INTRAVENOUS | Status: DC | PRN
  Administered 2021-09-30: 20 mg via INTRAVENOUS
  Administered 2021-09-30: 30 mg via INTRAVENOUS
  Administered 2021-09-30: 70 mg via INTRAVENOUS

## 2021-09-30 MED ORDER — GLYCOPYRROLATE 0.2 MG/ML IJ SOLN
INTRAMUSCULAR | Status: AC
  Filled 2021-09-30: qty 1

## 2021-09-30 MED ORDER — SODIUM CHLORIDE 0.9 % IV SOLN: INTRAVENOUS | Status: DC

## 2021-09-30 MED ORDER — EPHEDRINE SULFATE (PRESSORS) 50 MG/ML IJ SOLN
INTRAMUSCULAR | Status: DC | PRN
Start: 2021-09-30 — End: 2021-09-30
  Administered 2021-09-30: 10 mg via INTRAVENOUS
  Administered 2021-09-30: 5 mg via INTRAVENOUS

## 2021-09-30 MED ORDER — PROPOFOL 500 MG/50ML IV EMUL
INTRAVENOUS | Status: DC | PRN
  Administered 2021-09-30: 160 ug/kg/min via INTRAVENOUS

## 2021-09-30 MED ORDER — GLYCOPYRROLATE 0.2 MG/ML IJ SOLN
INTRAMUSCULAR | Status: DC | PRN
  Administered 2021-09-30: .2 mg via INTRAVENOUS

## 2021-09-30 NOTE — Anesthesia Procedure Notes (Signed)
Date/Time: 09/30/2021 12:34 PM ?Performed by: Demetrius Charity, CRNA ?Pre-anesthesia Checklist: Patient identified, Emergency Drugs available, Suction available, Patient being monitored and Timeout performed ?Patient Re-evaluated:Patient Re-evaluated prior to induction ?Oxygen Delivery Method: Nasal cannula ?Induction Type: IV induction ?Placement Confirmation: CO2 detector and positive ETCO2 ? ? ? ? ?

## 2021-09-30 NOTE — Interval H&P Note (Signed)
History and Physical Interval Note: ? ?09/30/2021 ?12:28 PM ? ?Todd Lucas  has presented today for surgery, with the diagnosis of Chronic Diarrhea.  The various methods of treatment have been discussed with the patient and family. After consideration of risks, benefits and other options for treatment, the patient has consented to  Procedure(s): ?COLONOSCOPY WITH PROPOFOL (N/A) as a surgical intervention.  The patient's history has been reviewed, patient examined, no change in status, stable for surgery.  I have reviewed the patient's chart and labs.  Questions were answered to the patient's satisfaction.   ? ? ?Hilton Cork Mathilde Mcwherter ? ?Ok to proceed with colonoscopy ?

## 2021-09-30 NOTE — Op Note (Signed)
[facility] ?Gastroenterology ?Patient Name: Todd Lucas ?Procedure Date: 09/30/2021 12:22 PM ?MRN: [id number] ?Account #: [id number] ?Date of Birth: August 10, 1940 ?Admit Type: Outpatient ?Age: 81 ?Room: [facility] ENDO ROOM 3 ?Gender: Male ?Note Status: Finalized ?Instrument Name: Colonoscope [id number] ?Procedure:             Colonoscopy ?Indications:           Chronic diarrhea ?Providers:             Andrey Farmer MD, MD ?Medicines:             Monitored Anesthesia Care ?Complications:         No immediate complications. Estimated blood loss:  ?                       Minimal. ?Procedure:             Pre-Anesthesia Assessment: ?                       - Prior to the procedure, a History and Physical was  ?                       performed, and patient medications and allergies were  ?                       reviewed. The patient is competent. The risks and  ?                       benefits of the procedure and the sedation options and  ?                       risks were discussed with the patient. All questions  ?                       were answered and informed consent was obtained.  ?                       Patient identification and proposed procedure were  ?                       verified by the physician, the nurse, the  ?                       anesthesiologist, the anesthetist and the technician  ?                       in the endoscopy suite. Mental Status Examination:  ?                       alert and oriented. Airway Examination: normal  ?                       oropharyngeal airway and neck mobility. Respiratory  ?                       Examination: clear to auscultation. CV Examination:  ?                       normal. Prophylactic Antibiotics: The patient does not  ?  require prophylactic antibiotics. Prior  ?                       Anticoagulants: The patient has taken Xarelto  ?                       (rivaroxaban), last dose was 2 days prior to  ?                        procedure. ASA Grade Assessment: II - A patient with  ?                       mild systemic disease. After reviewing the risks and  ?                       benefits, the patient was deemed in satisfactory  ?                       condition to undergo the procedure. The anesthesia  ?                       plan was to use monitored anesthesia care (MAC).  ?                       Immediately prior to administration of medications,  ?                       the patient was re-assessed for adequacy to receive  ?                       sedatives. The heart rate, respiratory rate, oxygen  ?                       saturations, blood pressure, adequacy of pulmonary  ?                       ventilation, and response to care were monitored  ?                       throughout the procedure. The physical status of the  ?                       patient was re-assessed after the procedure. ?                       After obtaining informed consent, the colonoscope was  ?                       passed under direct vision. Throughout the procedure,  ?                       the patient's blood pressure, pulse, and oxygen  ?                       saturations were monitored continuously. The  ?                       Colonoscope was introduced through the anus and  ?  advanced to the the terminal ileum. The colonoscopy  ?                       was performed without difficulty. The patient  ?                       tolerated the procedure well. The quality of the bowel  ?                       preparation was adequate to identify polyps. ?Findings: ?     The perianal and digital rectal examinations were normal. ?     The terminal ileum appeared normal. ?     Normal mucosa was found in the entire colon. Biopsies for histology were  ?     taken with a cold forceps from the entire colon for evaluation of  ?     microscopic colitis. Estimated blood loss was minimal. ?     Internal hemorrhoids were found during retroflexion. The  hemorrhoids  ?     were Grade I (internal hemorrhoids that do not prolapse). ?     The exam was otherwise without abnormality on direct and retroflexion  ?     views. ?Impression:            - The examined portion of the ileum was normal. ?                       - Normal mucosa in the entire examined colon. Biopsied. ?                       - Internal hemorrhoids. ?                       - The examination was otherwise normal on direct and  ?                       retroflexion views. ?Recommendation:        - Discharge patient to home. ?                       - Resume previous diet. ?                       - Continue present medications. ?                       - Await pathology results. ?                       - Repeat colonoscopy is not recommended due to current  ?                       age (33 years or older) for screening purposes. ?                       - Return to referring physician as previously  ?                       scheduled. ?Procedure Code(s):     --- Professional --- ?  42353, Colonoscopy, flexible; with biopsy, single or  ?                       multiple ?Diagnosis Code(s):     --- Professional --- ?                       K64.0, First degree hemorrhoids ?                       K52.9, Noninfective gastroenteritis and colitis,  ?                       unspecified ?CPT copyright 2019 American Medical Association. All rights reserved. ?The codes documented in this report are preliminary and upon coder review may  ?be revised to meet current compliance requirements. ?Andrey Farmer MD, MD ?09/30/2021 1:02:50 PM ?Number of Addenda: 0 ?Note Initiated On: 09/30/2021 12:22 PM ?Scope Withdrawal Time: 0 hours 11 minutes 56 seconds  ?Total Procedure Duration: 0 hours 19 minutes 42 seconds  ?Estimated Blood Loss:  Estimated blood loss was minimal. ?     [facility] ?

## 2021-09-30 NOTE — Transfer of Care (Signed)
Immediate Anesthesia Transfer of Care Note ? ?Patient: Todd Lucas ? ?Procedure(s) Performed: COLONOSCOPY WITH PROPOFOL ? ?Patient Location: PACU ? ?Anesthesia Type:General ? ?Level of Consciousness: awake and alert  ? ?Airway & Oxygen Therapy: Patient Spontanous Breathing and Patient connected to nasal cannula oxygen ? ?Post-op Assessment: Report given to RN and Post -op Vital signs reviewed and stable ? ?Post vital signs: Reviewed and stable ? ?Last Vitals:  ?Vitals Value Taken Time  ?BP    ?Temp    ?Pulse    ?Resp    ?SpO2    ? ? ?Last Pain:  ?Vitals:  ? 09/30/21 1203  ?TempSrc: Temporal  ?PainSc: 0-No pain  ?   ? ?  ? ?Complications: No notable events documented. ?

## 2021-09-30 NOTE — Anesthesia Preprocedure Evaluation (Signed)
Anesthesia Evaluation  ?Patient identified by MRN, date of birth, ID band ?Patient awake ? ? ? ?Reviewed: ?Allergy & Precautions, NPO status , Patient's Chart, lab work & pertinent test results ? ?Airway ?Mallampati: II ? ?TM Distance: >3 FB ?Neck ROM: full ? ? ? Dental ? ?(+) Chipped ?  ?Pulmonary ?neg shortness of breath, former smoker,  ?  ?Pulmonary exam normal ? ? ? ? ? ? ? Cardiovascular ?Exercise Tolerance: Good ?(-) angina(-) DOE + dysrhythmias Atrial Fibrillation  ? ? ?  ?Neuro/Psych ?negative neurological ROS ? negative psych ROS  ? GI/Hepatic ?negative GI ROS, Neg liver ROS, neg GERD  ,  ?Endo/Other  ?negative endocrine ROS ? Renal/GU ?negative Renal ROS  ?negative genitourinary ?  ?Musculoskeletal ? ? Abdominal ?  ?Peds ? Hematology ?negative hematology ROS ?(+)   ?Anesthesia Other Findings ?Past Medical History: ?No date: Atrial fibrillation ([place]) ?08/04/2018: Basal cell carcinoma ?    Comment:  right upper arm  ?No date: Dysrhythmia ?No date: Heart murmur ?05/26/2018: Hx of basal cell carcinoma ?    Comment:  R upper arm. Excised 07/15/2018, margins free. ?05/26/2018: Hx of squamous cell carcinoma of skin ?    Comment:  L lateral calf ?No date: Loose stools ? ?Past Surgical History: ?No date: aneurysm artery iliac common ?No date: heart ablation ?No date: INSERT / REPLACE / REMOVE PACEMAKER ? ?BMI   ? Body Mass Index: 21.11 kg/m?  ?  ? ? Reproductive/Obstetrics ?negative OB ROS ? ?  ? ? ? ? ? ? ? ? ? ? ? ? ? ?  ?  ? ? ? ? ? ? ? ? ?Anesthesia Physical ?Anesthesia Plan ? ?ASA: 3 ? ?Anesthesia Plan: General  ? ?Post-op Pain Management:   ? ?Induction: Intravenous ? ?PONV Risk Score and Plan: Propofol infusion and TIVA ? ?Airway Management Planned: Natural Airway and Nasal Cannula ? ?Additional Equipment:  ? ?Intra-op Plan:  ? ?Post-operative Plan:  ? ?Informed Consent: I have reviewed the patients History and Physical, chart, labs and discussed the procedure including the  risks, benefits and alternatives for the proposed anesthesia with the patient or authorized representative who has indicated his/her understanding and acceptance.  ? ? ? ?Dental Advisory Given ? ?Plan Discussed with: Anesthesiologist, CRNA and Surgeon ? ?Anesthesia Plan Comments: (Patient consented for risks of anesthesia including but not limited to:  ?- adverse reactions to medications ?- risk of airway placement if required ?- damage to eyes, teeth, lips or other oral mucosa ?- nerve damage due to positioning  ?- sore throat or hoarseness ?- Damage to heart, brain, nerves, lungs, other parts of body or loss of life ? ?Patient voiced understanding.)  ? ? ? ? ? ? ?Anesthesia Quick Evaluation ? ?

## 2021-09-30 NOTE — H&P (Signed)
Outpatient short stay form Pre-procedure ?09/30/2021  ?Lesly Rubenstein, MD ? ?Primary Physician: Maryland Pink, MD ? ?Reason for visit:  Chronic diarrhea ? ?History of present illness:   ? ?81 y/o gentleman with history of bicuspid aortic valve, a. Fib on DOAC with last dose 48 hours ago, and pacemaker here for colonoscopy for chronic diarrhea. Last colonoscopy was in 2014 in [place]. History of inguinal hernia repairs. No family history of GI malignancies. ? ? ? ?Current Facility-Administered Medications:  ?  0.9 %  sodium chloride infusion, , Intravenous, Continuous, Znya Albino, Hilton Cork, MD, Last Rate: 20 mL/hr at 09/30/21 1220, Continued from Pre-op at 09/30/21 1220 ? ?Medications Prior to Admission  ?Medication Sig Dispense Refill Last Dose  ? apixaban (ELIQUIS) 5 MG TABS tablet Take 5 mg by mouth 2 (two) times daily.   09/27/2021 at 1700  ? cetirizine (ZYRTEC) 10 MG tablet Take 10 mg by mouth daily.     ? diltiazem (CARDIZEM) 30 MG tablet Take 30 mg by mouth 3 (three) times daily.   Past Month  ? levothyroxine (SYNTHROID, LEVOTHROID) 75 MCG tablet Take 88 mcg by mouth daily before breakfast.   09/30/2021  ? rivaroxaban (XARELTO) 10 MG TABS tablet Take 10 mg by mouth daily.   09/27/2021  ? rosuvastatin (CRESTOR) 10 MG tablet Take 10 mg by mouth daily.   09/29/2021  ? zolpidem (AMBIEN) 10 MG tablet Take 10 mg by mouth at bedtime as needed for sleep.   Past Week  ? celecoxib (CELEBREX) 100 MG capsule Take 100 mg by mouth 2 (two) times daily. (Patient not taking: Reported on 09/30/2021)   Not Taking  ? gabapentin (NEURONTIN) 100 MG capsule Take by mouth.     ? ? ? ?No Known Allergies ? ? ?Past Medical History:  ?Diagnosis Date  ? Atrial fibrillation ([place])   ? Basal cell carcinoma 08/04/2018  ? right upper arm   ? Dysrhythmia   ? Heart murmur   ? Hx of basal cell carcinoma 05/26/2018  ? R upper arm. Excised 07/15/2018, margins free.  ? Hx of squamous cell carcinoma of skin 05/26/2018  ? L lateral calf  ? Loose stools    ? ? ?Review of systems:  Otherwise negative.  ? ? ?Physical Exam ? ?Gen: Alert, oriented. Appears stated age.  ?HEENT: PERRLA. ?Lungs: No respiratory distress ?CV: RRR ?Abd: soft, benign, no masses ?Ext: No edema ? ? ? ?Planned procedures: Proceed with colonoscopy. The patient understands the nature of the planned procedure, indications, risks, alternatives and potential complications including but not limited to bleeding, infection, perforation, damage to internal organs and possible oversedation/side effects from anesthesia. The patient agrees and gives consent to proceed.  ?Please refer to procedure notes for findings, recommendations and patient disposition/instructions.  ? ? ? ?Lesly Rubenstein, MD ?[facility] ? ? ? ?  ? ?

## 2021-09-30 NOTE — Anesthesia Postprocedure Evaluation (Signed)
Anesthesia Post Note ? ?Patient: Todd Lucas ? ?Procedure(s) Performed: COLONOSCOPY WITH PROPOFOL ? ?Patient location during evaluation: Endoscopy ?Anesthesia Type: General ?Level of consciousness: awake and alert ?Pain management: pain level controlled ?Vital Signs Assessment: post-procedure vital signs reviewed and stable ?Respiratory status: spontaneous breathing, nonlabored ventilation, respiratory function stable and patient connected to nasal cannula oxygen ?Cardiovascular status: blood pressure returned to baseline and stable ?Postop Assessment: no apparent nausea or vomiting ?Anesthetic complications: no ? ? ?No notable events documented. ? ? ?Last Vitals:  ?Vitals:  ? 09/30/21 1321 09/30/21 1331  ?BP: (!) 141/86 (!) 143/86  ?Pulse: 65   ?Resp: 13   ?Temp:    ?SpO2: 98%   ?  ?Last Pain:  ?Vitals:  ? 09/30/21 1331  ?TempSrc:   ?PainSc: 0-No pain  ? ? ?  ?  ?  ?  ?  ?  ? ?[name] ? ? ? ? ?

## 2021-10-01 ENCOUNTER — Encounter: Payer: Self-pay | Admitting: Gastroenterology

## 2021-10-01 LAB — SURGICAL PATHOLOGY

## 2021-12-10 ENCOUNTER — Ambulatory Visit: Payer: Medicare Other | Admitting: Physical Therapy

## 2021-12-12 ENCOUNTER — Encounter: Payer: Medicare Other | Admitting: Physical Therapy

## 2021-12-17 ENCOUNTER — Encounter: Payer: PRIVATE HEALTH INSURANCE | Admitting: Physical Therapy

## 2021-12-19 ENCOUNTER — Encounter: Payer: PRIVATE HEALTH INSURANCE | Admitting: Physical Therapy

## 2021-12-24 ENCOUNTER — Encounter: Payer: PRIVATE HEALTH INSURANCE | Admitting: Physical Therapy

## 2021-12-25 ENCOUNTER — Ambulatory Visit (INDEPENDENT_AMBULATORY_CARE_PROVIDER_SITE_OTHER): Payer: Medicare Other | Admitting: Cardiology

## 2021-12-25 ENCOUNTER — Encounter: Payer: Self-pay | Admitting: Cardiology

## 2021-12-25 VITALS — BP 132/80 | HR 64 | Ht 73.0 in | Wt 163.0 lb

## 2021-12-25 DIAGNOSIS — I480 Paroxysmal atrial fibrillation: Secondary | ICD-10-CM | POA: Diagnosis not present

## 2021-12-25 DIAGNOSIS — Z950 Presence of cardiac pacemaker: Secondary | ICD-10-CM

## 2021-12-25 NOTE — Progress Notes (Signed)
Electrophysiology Office Note:    Date:  12/25/2021   ID:  Todd Lucas, DOB 1940-08-22, MRN [id number]  PCP:  Maryland Pink, MD  [facility] Cardiologist:  None  [facility] Electrophysiologist:  Vickie Epley, MD   Referring MD: Maryland Pink, MD   Chief Complaint: Atrial fibrillation  History of Present Illness:    Todd Lucas is a 81 y.o. male who presents for an evaluation of atrial fibrillation at the request of Dr. Corky Sox. Their medical history includes bicuspid aortic valve, paroxysmal atrial fibrillation post ablation in 2014, AV block post dual-chamber permanent pacemaker, dilated aortic root, iliac aneurysms bilaterally, hypertension.  The patient was last seen by Dr. Corky Sox November 06, 2021.  He continues to have symptomatic episodes of atrial fibrillation by history and device interrogation.  He experiences lightheadedness, dyspnea and poor exercise tolerance when in atrial fibrillation.  The patient is referred to discuss antiarrhythmic drugs versus repeat ablation.       Past Medical History:  Diagnosis Date   Atrial fibrillation ([place])    Basal cell carcinoma 08/04/2018   right upper arm    Dysrhythmia    Heart murmur    Hx of basal cell carcinoma 05/26/2018   R upper arm. Excised 07/15/2018, margins free.   Hx of squamous cell carcinoma of skin 05/26/2018   L lateral calf   Loose stools     Past Surgical History:  Procedure Laterality Date   aneurysm artery iliac common     COLONOSCOPY WITH PROPOFOL N/A 09/30/2021   Procedure: COLONOSCOPY WITH PROPOFOL;  Surgeon: Lesly Rubenstein, MD;  Location: [facility] ENDOSCOPY;  Service: Endoscopy;  Laterality: N/A;   heart ablation     INSERT / REPLACE / REMOVE PACEMAKER      Current Medications: Current Meds  Medication Sig   amoxicillin (AMOXIL) 500 MG tablet Take by mouth. Take 4 tablets an hour before prior to dental procedures   apixaban (ELIQUIS) 5 MG TABS tablet Take 5 mg by mouth 2 (two) times  daily.   levothyroxine (SYNTHROID) 88 MCG tablet Take 88 mcg by mouth every morning.   rivaroxaban (XARELTO) 10 MG TABS tablet Take 10 mg by mouth daily.   rosuvastatin (CRESTOR) 10 MG tablet Take 10 mg by mouth daily.   zolpidem (AMBIEN) 10 MG tablet Take 10 mg by mouth at bedtime as needed for sleep.     Allergies:   Patient has no known allergies.   Social History   Socioeconomic History   Marital status: Married    Spouse name: Not on file   Number of children: Not on file   Years of education: Not on file   Highest education level: Not on file  Occupational History   Not on file  Tobacco Use   Smoking status: Former    Types: Cigarettes   Smokeless tobacco: Never  Vaping Use   Vaping Use: Never used  Substance and Sexual Activity   Alcohol use: Yes    Alcohol/week: 2.0 standard drinks of alcohol    Types: 2 Glasses of wine per week    Comment: nightly   Drug use: Never   Sexual activity: Not on file  Other Topics Concern   Not on file  Social History Narrative   Not on file   Social Determinants of Health   Financial Resource Strain: Not on file  Food Insecurity: Not on file  Transportation Needs: Not on file  Physical Activity: Not on file  Stress: Not on file  Social Connections: Not on file     Family History: The patient's family history is not on file.  ROS:   Please see the history of present illness.    All other systems reviewed and are negative.  EKGs/Labs/Other Studies Reviewed:    The following studies were reviewed today:  Nov 25, 2021 SPECT FINDINGS:  Regional wall motion:  reveals normal myocardial thickening and wall  motion.  The overall quality of the study is fair.    Artifacts noted: yes  Left ventricular cavity: normal.    December 25, 2021 in clinic device interrogation personally reviewed DDDR 50-1 20 Battery longevity 15 months Lead parameters stable Atrial lead is a 4076 implanted in 2012 Ventricular lead is a 5076  implanted in 2012 43 days without atrial fibrillation based on the interrogation today.  EKG:  The ekg ordered today demonstrates sinus rhythm.  LVH with secondary repolarization abnormalities   Recent Labs: No results found for requested labs within last 365 days.  Recent Lipid Panel No results found for: "CHOL", "TRIG", "HDL", "CHOLHDL", "VLDL", "LDLCALC", "LDLDIRECT"  Physical Exam:    VS:  BP 132/80   Pulse 64   Ht '6\' 1"'$  (1.854 m)   Wt 163 lb (73.9 kg)   SpO2 98%   BMI 21.51 kg/m     Wt Readings from Last 3 Encounters:  12/25/21 163 lb (73.9 kg)  09/30/21 160 lb (72.6 kg)  10/20/18 170 lb (77.1 kg)     GEN:  Well nourished, well developed in no acute distress HEENT: Normal NECK: No JVD; No carotid bruits LYMPHATICS: No lymphadenopathy CARDIAC: RRR, no murmurs, rubs, gallops.  Pacemaker pocket well-healed RESPIRATORY:  Clear to auscultation without rales, wheezing or rhonchi  ABDOMEN: Soft, non-tender, non-distended MUSCULOSKELETAL:  No edema; No deformity  SKIN: Warm and dry NEUROLOGIC:  Alert and oriented x 3 PSYCHIATRIC:  Normal affect       ASSESSMENT:    1. Paroxysmal atrial fibrillation (HCC)   2. Cardiac pacemaker in situ    PLAN:    In order of problems listed above:  #Paroxysmal atrial fibrillation Very low burden.  Prior ablation (cryoablation in 2014).  On Eliquis for stroke prophylaxis.  For now, continue monitoring burden of atrial fibrillation.  If atrial fibrillation burden increases, could revisit repeat ablation versus antiarrhythmic drug therapy.  For now continue Eliquis.  #Pacemaker in situ Device functioning appropriately.  Established with our clinic for remote monitoring. Plan to discuss generator replacement procedure at next appointment.  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   Medication Adjustments/Labs and Tests  Ordered: Current medicines are reviewed at length with the patient today.  Concerns regarding medicines are outlined above.  Orders Placed This Encounter  Procedures   EKG 12-Lead   No orders of the defined types were placed in this encounter.    Signed, Hilton Cork. Quentin Ore, MD, [facility], [facility] 12/25/2021 8:59 PM    Electrophysiology  [facility]

## 2021-12-25 NOTE — Patient Instructions (Signed)
Medications: Your physician recommends that you continue on your current medications as directed. Please refer to the Current Medication list given to you today. *If you need a refill on your cardiac medications before your next appointment, please call your pharmacy*  Lab Work: None. If you have labs (blood work) drawn today and your tests are completely normal, you will receive your results only by: [place] (if you have MyChart) OR A paper copy in the mail If you have any lab test that is abnormal or we need to change your treatment, we will call you to review the results.  Testing/Procedures: None.  Follow-Up: At [facility], you and your health needs are our priority.  As part of our continuing mission to provide you with exceptional heart care, we have created designated Provider Care Teams.  These Care Teams include your primary Cardiologist (physician) and Advanced Practice Providers (APPs -  Physician Assistants and Nurse Practitioners) who all work together to provide you with the care you need, when you need it.  Your physician wants you to follow-up in: 6 months with Lars Mage, MD  We recommend signing up for the patient portal called "MyChart".  Sign up information is provided on this After Visit Summary.  MyChart is used to connect with patients for Virtual Visits (Telemedicine).  Patients are able to view lab/test results, encounter notes, upcoming appointments, etc.  Non-urgent messages can be sent to your provider as well.   To learn more about what you can do with MyChart, go to [email or web address].    Any Other Special Instructions Will Be Listed Below (If Applicable).

## 2021-12-26 ENCOUNTER — Encounter: Payer: PRIVATE HEALTH INSURANCE | Admitting: Physical Therapy

## 2021-12-26 ENCOUNTER — Telehealth: Payer: Self-pay | Admitting: *Deleted

## 2021-12-26 DIAGNOSIS — I480 Paroxysmal atrial fibrillation: Secondary | ICD-10-CM

## 2021-12-26 NOTE — Telephone Encounter (Signed)
Please order echo for LVH.  Thanks,  Lysbeth Galas

## 2021-12-31 ENCOUNTER — Encounter: Payer: PRIVATE HEALTH INSURANCE | Admitting: Physical Therapy

## 2022-01-02 ENCOUNTER — Encounter: Payer: PRIVATE HEALTH INSURANCE | Admitting: Physical Therapy

## 2022-01-08 ENCOUNTER — Encounter: Payer: PRIVATE HEALTH INSURANCE | Admitting: Physical Therapy

## 2022-01-09 ENCOUNTER — Telehealth: Payer: Self-pay | Admitting: Cardiology

## 2022-01-09 NOTE — Telephone Encounter (Signed)
Pt's wife states that they miss told Dr. Quentin Ore at the last visit that his echo was 02/24/19, but his last echo was actually 3//17/22 under Dr. Ubaldo Glassing at [facility].   They would like to know if this is still okay for him to get his echo done again in August 2023 this year and will this effect billing. Please advise.

## 2022-01-10 ENCOUNTER — Encounter: Payer: PRIVATE HEALTH INSURANCE | Admitting: Physical Therapy

## 2022-01-13 ENCOUNTER — Encounter: Payer: PRIVATE HEALTH INSURANCE | Admitting: Physical Therapy

## 2022-01-16 ENCOUNTER — Encounter: Payer: PRIVATE HEALTH INSURANCE | Admitting: Physical Therapy

## 2022-01-20 ENCOUNTER — Encounter: Payer: PRIVATE HEALTH INSURANCE | Admitting: Physical Therapy

## 2022-01-23 ENCOUNTER — Encounter: Payer: PRIVATE HEALTH INSURANCE | Admitting: Physical Therapy

## 2022-01-23 NOTE — Telephone Encounter (Signed)
Left message to call back  

## 2022-01-27 ENCOUNTER — Encounter: Payer: PRIVATE HEALTH INSURANCE | Admitting: Physical Therapy

## 2022-01-28 ENCOUNTER — Other Ambulatory Visit: Payer: PRIVATE HEALTH INSURANCE

## 2022-01-30 ENCOUNTER — Encounter: Payer: PRIVATE HEALTH INSURANCE | Admitting: Physical Therapy

## 2022-02-03 ENCOUNTER — Encounter: Payer: PRIVATE HEALTH INSURANCE | Admitting: Physical Therapy

## 2022-02-06 ENCOUNTER — Encounter: Payer: PRIVATE HEALTH INSURANCE | Admitting: Physical Therapy

## 2022-02-10 ENCOUNTER — Encounter: Payer: PRIVATE HEALTH INSURANCE | Admitting: Physical Therapy

## 2022-02-13 ENCOUNTER — Encounter: Payer: PRIVATE HEALTH INSURANCE | Admitting: Physical Therapy

## 2022-02-25 ENCOUNTER — Ambulatory Visit (INDEPENDENT_AMBULATORY_CARE_PROVIDER_SITE_OTHER): Payer: Medicare Other

## 2022-02-25 DIAGNOSIS — I480 Paroxysmal atrial fibrillation: Secondary | ICD-10-CM | POA: Diagnosis not present

## 2022-02-25 LAB — ECHOCARDIOGRAM COMPLETE
AR max vel: 4.14 cm2
AV Area VTI: 4.52 cm2
AV Area mean vel: 3.85 cm2
AV Mean grad: 5 mmHg
AV Peak grad: 9.6 mmHg
AV Vena cont: 0.45 cm
Ao pk vel: 1.55 m/s
Area-P 1/2: 1.81 cm2
S' Lateral: 3.5 cm
Single Plane A4C EF: 56 %

## 2022-02-26 ENCOUNTER — Telehealth: Payer: Self-pay | Admitting: Cardiology

## 2022-02-26 NOTE — Telephone Encounter (Signed)
Wife to call to say that patient had echo done in 2022 so that he can compare the one from this year. Please advise

## 2022-03-19 ENCOUNTER — Encounter: Payer: Self-pay | Admitting: Dermatology

## 2022-03-19 ENCOUNTER — Ambulatory Visit (INDEPENDENT_AMBULATORY_CARE_PROVIDER_SITE_OTHER): Payer: Medicare Other | Admitting: Dermatology

## 2022-03-19 DIAGNOSIS — L821 Other seborrheic keratosis: Secondary | ICD-10-CM

## 2022-03-19 DIAGNOSIS — Z1283 Encounter for screening for malignant neoplasm of skin: Secondary | ICD-10-CM | POA: Diagnosis not present

## 2022-03-19 DIAGNOSIS — L814 Other melanin hyperpigmentation: Secondary | ICD-10-CM

## 2022-03-19 DIAGNOSIS — Z85828 Personal history of other malignant neoplasm of skin: Secondary | ICD-10-CM

## 2022-03-19 DIAGNOSIS — L578 Other skin changes due to chronic exposure to nonionizing radiation: Secondary | ICD-10-CM | POA: Diagnosis not present

## 2022-03-19 DIAGNOSIS — L570 Actinic keratosis: Secondary | ICD-10-CM

## 2022-03-19 DIAGNOSIS — D229 Melanocytic nevi, unspecified: Secondary | ICD-10-CM

## 2022-03-19 DIAGNOSIS — D1800 Hemangioma unspecified site: Secondary | ICD-10-CM

## 2022-03-19 NOTE — Progress Notes (Signed)
Follow-Up Visit   Subjective  Todd Lucas is a 81 y.o. male who presents for the following: Annual Exam (HxBCC's. HxSCC. Hx of AK's. Areas of concern today. Hx of PDT treatment in the past).  The patient presents for Total-Body Skin Exam (TBSE) for skin cancer screening and mole check.  The patient has spots, moles and lesions to be evaluated, some may be new or changing and the patient has concerns that these could be cancer.   The following portions of the chart were reviewed this encounter and updated as appropriate:  Tobacco  Allergies  Meds  Problems  Med Hx  Surg Hx  Fam Hx      Review of Systems: No other skin or systemic complaints except as noted in HPI or Assessment and Plan.   Objective  Well appearing patient in no apparent distress; mood and affect are within normal limits.  A full examination was performed including scalp, head, eyes, ears, nose, lips, neck, chest, axillae, abdomen, back, buttocks, bilateral upper extremities, bilateral lower extremities, hands, feet, fingers, toes, fingernails, and toenails. All findings within normal limits unless otherwise noted below.  right temple x1, left forehead x1, left ear tragus x1, left cheek x2, (5) Erythematous thin papules/macules with gritty scale.    Assessment & Plan   History of Basal Cell Carcinoma of the Skin - No evidence of recurrence today - Recommend regular full body skin exams - Recommend daily broad spectrum sunscreen SPF 30+ to sun-exposed areas, reapply every 2 hours as needed.  - Call if any new or changing lesions are noted between office visits   History of Squamous Cell Carcinoma of the Skin - No evidence of recurrence today - No lymphadenopathy - Recommend regular full body skin exams - Recommend daily broad spectrum sunscreen SPF 30+ to sun-exposed areas, reapply every 2 hours as needed.  - Call if any new or changing lesions are noted between office visits  Lentigines - Scattered  tan macules - Due to sun exposure - Benign-appearing, observe - Recommend daily broad spectrum sunscreen SPF 30+ to sun-exposed areas, reapply every 2 hours as needed. - Call for any changes  Seborrheic Keratoses - Stuck-on, waxy, tan-brown papules and/or plaques  - Benign-appearing - Discussed benign etiology and prognosis. - Observe - Call for any changes  Melanocytic Nevi - Tan-brown and/or pink-flesh-colored symmetric macules and papules - Benign appearing on exam today - Observation - Call clinic for new or changing moles - Recommend daily use of broad spectrum spf 30+ sunscreen to sun-exposed areas.   Hemangiomas - Red papules - Discussed benign nature - Observe - Call for any changes  Actinic Damage - Severe, confluent actinic changes with pre-cancerous actinic keratoses  - Severe, chronic, not at goal, secondary to cumulative UV radiation exposure over time - diffuse scaly erythematous macules and papules with underlying dyspigmentation - Discussed Prescription "Field Treatment" for Severe, Chronic Confluent Actinic Changes with Pre-Cancerous Actinic Keratoses Field treatment involves treatment of an entire area of skin that has confluent Actinic Changes (Sun/ Ultraviolet light damage) and PreCancerous Actinic Keratoses by method of PhotoDynamic Therapy (PDT) and/or prescription Topical Chemotherapy agents such as 5-fluorouracil, 5-fluorouracil/calcipotriene, and/or imiquimod.  The purpose is to decrease the number of clinically evident and subclinical PreCancerous lesions to prevent progression to development of skin cancer by chemically destroying early precancer changes that may or may not be visible.  It has been shown to reduce the risk of developing skin cancer in the treated area. As a  result of treatment, redness, scaling, crusting, and open sores may occur during treatment course. One or more than one of these methods may be used and may have to be used several times to  control, suppress and eliminate the PreCancerous changes. Discussed treatment course, expected reaction, and possible side effects. - Recommend daily broad spectrum sunscreen SPF 30+ to sun-exposed areas, reapply every 2 hours as needed.  - Staying in the shade or wearing long sleeves, sun glasses (UVA+UVB protection) and wide brim hats (4-inch brim around the entire circumference of the hat) are also recommended. - Call for new or changing lesions.  - Plan Red light PDT  Skin cancer screening performed today.  AK (actinic keratosis) (5) right temple x1, left forehead x1, left ear tragus x1, left cheek x2,  Actinic keratoses are precancerous spots that appear secondary to cumulative UV radiation exposure/sun exposure over time. They are chronic with expected duration over 1 year. A portion of actinic keratoses will progress to squamous cell carcinoma of the skin. It is not possible to reliably predict which spots will progress to skin cancer and so treatment is recommended to prevent development of skin cancer.  Recommend daily broad spectrum sunscreen SPF 30+ to sun-exposed areas, reapply every 2 hours as needed.  Recommend staying in the shade or wearing long sleeves, sun glasses (UVA+UVB protection) and wide brim hats (4-inch brim around the entire circumference of the hat). Call for new or changing lesions.   Destruction of lesion - right temple x1, left forehead x1, left ear tragus x1, left cheek x2,  Destruction method: cryotherapy   Informed consent: discussed and consent obtained   Lesion destroyed using liquid nitrogen: Yes   Outcome: patient tolerated procedure well with no complications   Post-procedure details: wound care instructions given   Additional details:  Prior to procedure, discussed risks of blister formation, small wound, skin dyspigmentation, or rare scar following cryotherapy. Recommend Vaseline ointment to treated areas while healing.    Return in about 6 months  (around 09/17/2022) for TBSE, AK Follow Up.  I, Emelia Salisbury, CMA, am acting as scribe for Forest Gleason, MD. Documentation: I have reviewed the above documentation for accuracy and completeness, and I agree with the above.  Forest Gleason, MD

## 2022-03-19 NOTE — Patient Instructions (Addendum)
Cryotherapy Aftercare  Wash gently with soap and water everyday.   Apply Vaseline daily until healed.   Call Dr. Laurence Ferrari if you haven't heard from her by November regarding red light for precancer treatment.   Prior to procedure, discussed risks of blister formation, small wound, skin dyspigmentation, or rare scar following cryotherapy. Recommend Vaseline ointment to treated areas while healing.   Recommend taking Heliocare sun protection supplement daily in sunny weather for additional sun protection. For maximum protection on the sunniest days, you can take up to 2 capsules of regular Heliocare OR take 1 capsule of Heliocare Ultra. For prolonged exposure (such as a full day in the sun), you can repeat your dose of the supplement 4 hours after your first dose. Heliocare can be purchased at [employer], at some [facility] or at [email or web address].    Recommend daily broad spectrum sunscreen SPF 30+ to sun-exposed areas, reapply every 2 hours as needed. Call for new or changing lesions.  Staying in the shade or wearing long sleeves, sun glasses (UVA+UVB protection) and wide brim hats (4-inch brim around the entire circumference of the hat) are also recommended for sun protection.   Recommend Niacinamide or Nicotinamide '500mg'$  twice per day to lower risk of non-melanoma skin cancer by approximately 25%. This is usually available at [employer].    Melanoma ABCDEs  Melanoma is the most dangerous type of skin cancer, and is the leading cause of death from skin disease.  You are more likely to develop melanoma if you: Have light-colored skin, light-colored eyes, or red or blond hair Spend a lot of time in the sun Tan regularly, either outdoors or in a tanning bed Have had blistering sunburns, especially during childhood Have a close family member who has had a melanoma Have atypical moles or large birthmarks  Early detection of melanoma is key since treatment is typically straightforward  and cure rates are extremely high if we catch it early.   The first sign of melanoma is often a change in a mole or a new dark spot.  The ABCDE system is a way of remembering the signs of melanoma.  A for asymmetry:  The two halves do not match. B for border:  The edges of the growth are irregular. C for color:  A mixture of colors are present instead of an even brown color. D for diameter:  Melanomas are usually (but not always) greater than 76m - the size of a pencil eraser. E for evolution:  The spot keeps changing in size, shape, and color.  Please check your skin once per month between visits. You can use a small mirror in front and a large mirror behind you to keep an eye on the back side or your body.   If you see any new or changing lesions before your next follow-up, please call to schedule a visit.  Please continue daily skin protection including broad spectrum sunscreen SPF 30+ to sun-exposed areas, reapplying every 2 hours as needed when you're outdoors.   Staying in the shade or wearing long sleeves, sun glasses (UVA+UVB protection) and wide brim hats (4-inch brim around the entire circumference of the hat) are also recommended for sun protection.     Due to recent changes in healthcare laws, you may see results of your pathology and/or laboratory studies on MyChart before the doctors have had a chance to review them. We understand that in some cases there may be results that are confusing or concerning to you.  Please understand that not all results are received at the same time and often the doctors may need to interpret multiple results in order to provide you with the best plan of care or course of treatment. Therefore, we ask that you please give [place] 2 business days to thoroughly review all your results before contacting the office for clarification. Should we see a critical lab result, you will be contacted sooner.   If You Need Anything After Your Visit  If you have any  questions or concerns for your doctor, please call our main line at [phone] and press option 4 to reach your doctor's medical assistant. If no one answers, please leave a voicemail as directed and we will return your call as soon as possible. Messages left after 4 pm will be answered the following business day.   You may also send [place] a message via [place]. We typically respond to MyChart messages within 1-2 business days.  For prescription refills, please ask your pharmacy to contact our office. Our fax number is [phone].  If you have an urgent issue when the clinic is closed that cannot wait until the next business day, you can page your doctor at the number below.    Please note that while we do our best to be available for urgent issues outside of office hours, we are not available 24/7.   If you have an urgent issue and are unable to reach [place], you may choose to seek medical care at your doctor's office, retail clinic, urgent care center, or emergency room.  If you have a medical emergency, please immediately call 911 or go to the emergency department.  Pager Numbers  - Dr. Nehemiah Massed: [phone]  - Dr. Laurence Ferrari: [phone]  - Dr. Nicole Kindred: [phone]  In the event of inclement weather, please call our main line at [phone] for an update on the status of any delays or closures.  Dermatology Medication Tips: Please keep the boxes that topical medications come in in order to help keep track of the instructions about where and how to use these. Pharmacies typically print the medication instructions only on the boxes and not directly on the medication tubes.   If your medication is too expensive, please contact our office at [phone] option 4 or send [place] a message through [place].   We are unable to tell what your co-pay for medications will be in advance as this is different depending on your insurance coverage. However, we may be able to find a substitute medication at  lower cost or fill out paperwork to get insurance to cover a needed medication.   If a prior authorization is required to get your medication covered by your insurance company, please allow [place] 1-2 business days to complete this process.  Drug prices often vary depending on where the prescription is filled and some pharmacies may offer cheaper prices.  The website www.goodrx.com contains coupons for medications through different pharmacies. The prices here do not account for what the cost may be with help from insurance (it may be cheaper with your insurance), but the website can give you the price if you did not use any insurance.  - You can print the associated coupon and take it with your prescription to the pharmacy.  - You may also stop by our office during regular business hours and pick up a GoodRx coupon card.  - If you need your prescription sent electronically to a different pharmacy, notify our office through [facility]  or by phone at [phone] option 4.     Si Usted Necesita Algo Despus de Su Visita  Tambin puede enviarnos un mensaje a travs de Pharmacist, community. Por lo general respondemos a los mensajes de MyChart en el transcurso de 1 a 2 das hbiles.  Para renovar recetas, por favor pida a su farmacia que se ponga en contacto con nuestra oficina. Harland Dingwall de fax es [place] [phone].  Si tiene un asunto urgente cuando la clnica est cerrada y que no puede esperar hasta el siguiente da hbil, puede llamar/localizar a su doctor(a) al nmero que aparece a continuacin.   Por favor, tenga en cuenta que aunque hacemos todo lo posible para estar disponibles para asuntos urgentes fuera del horario de [place], no estamos disponibles las 24 horas del da, los 7 das de la [place].   Si tiene un problema urgente y no puede comunicarse con nosotros, puede optar por buscar atencin mdica  en el consultorio de su doctor(a), en una clnica privada, en un centro de atencin urgente  o en una sala de emergencias.  Si tiene Engineering geologist, por favor llame inmediatamente al 911 o vaya a la sala de emergencias.  Nmeros de bper  - Dr. Nehemiah Massed: [phone]  - Dra. Moye: [phone]  - Dra. Nicole Kindred: [phone]  En caso de inclemencias del [place], por favor llame a Johnsie Kindred principal al [phone] para una actualizacin sobre el [place] de cualquier retraso o cierre.  Consejos para la medicacin en dermatologa: Por favor, guarde las cajas en las que vienen los medicamentos de uso tpico para ayudarle a seguir las instrucciones sobre dnde y cmo usarlos. Las farmacias generalmente imprimen las instrucciones del medicamento slo en las cajas y no directamente en los tubos del [place].   Si su medicamento es muy caro, por favor, pngase en contacto con Zigmund Daniel llamando al [phone] y presione la opcin 4 o envenos un mensaje a travs de Pharmacist, community.   No podemos decirle cul ser su copago por los medicamentos por adelantado ya que esto es diferente dependiendo de la cobertura de su seguro. Sin embargo, es posible que podamos encontrar un medicamento sustituto a Electrical engineer un formulario para que el seguro cubra el medicamento que se considera necesario.   Si se requiere una autorizacin previa para que su compaa de seguros [place] su medicamento, por favor permtanos de 1 a 2 das hbiles para completar este proceso.  Los precios de los medicamentos varan con frecuencia dependiendo del Environmental consultant de dnde se surte la receta y alguna farmacias pueden ofrecer precios ms baratos.  El sitio web www.goodrx.com tiene cupones para medicamentos de Airline pilot. Los precios aqu no tienen en cuenta lo que podra costar con la ayuda del seguro (puede ser ms barato con su seguro), pero el sitio web puede darle el precio si no utiliz Research scientist (physical sciences).  - Puede imprimir el cupn correspondiente y llevarlo con su receta a la farmacia.  - Tambin  puede pasar por nuestra oficina durante el horario de atencin regular y Charity fundraiser una tarjeta de cupones de GoodRx.  - Si necesita que su receta se enve electrnicamente a una farmacia diferente, informe a nuestra oficina a travs de MyChart de [place] o por telfono llamando al [phone] y presione la opcin 4.

## 2022-03-30 ENCOUNTER — Encounter: Payer: Self-pay | Admitting: Dermatology

## 2022-04-29 ENCOUNTER — Telehealth: Payer: Self-pay

## 2022-04-29 NOTE — Telephone Encounter (Signed)
Patient left message about scheduling for redlight. I have left him a VM that I will call him once training and equipment is set up and ready to go.

## 2022-05-08 ENCOUNTER — Telehealth: Payer: Self-pay

## 2022-05-08 NOTE — Telephone Encounter (Signed)
Dr. Laurence Ferrari would like patient to schedule redlight to be apart of training next Thursday.  I have called and left VM for pt to return my call. aw

## 2022-05-15 ENCOUNTER — Ambulatory Visit (INDEPENDENT_AMBULATORY_CARE_PROVIDER_SITE_OTHER): Payer: Medicare Other | Admitting: Dermatology

## 2022-05-15 ENCOUNTER — Ambulatory Visit: Payer: Medicare Other

## 2022-05-15 DIAGNOSIS — L570 Actinic keratosis: Secondary | ICD-10-CM | POA: Diagnosis not present

## 2022-05-15 MED ORDER — AMINOLEVULINIC ACID HCL 10 % EX GEL
2000.0000 mg | Freq: Once | CUTANEOUS | Status: AC
  Administered 2022-05-15: 2000 mg via TOPICAL

## 2022-05-15 NOTE — Patient Instructions (Signed)
Ameluz/ Red light Treatment Common Side Effects  - Burning/stinging, which may be severe and last up to 24-72 hours after your treatment  - Redness, swelling and/or peeling which may last up to 4 weeks  - Scaling/crusting which may last up to 2 weeks  - Sun sensitivity (you MUST avoid sun exposure for 48-72 hours after treatment)  Care Instructions  - Okay to wash with soap and water and shampoo as normal  - If needed, you can do a cold compress (ex. Ice packs) for comfort  - If okay with your Primary Doctor, you may use analgesics such as Tylenol every 4-6 hours, not to exceed recommended dose  - You may apply Cerave Healing Ointment, Vaseline or Aquaphor  - If you have a lot of swelling you may take a Benadryl to help with this (this may cause drowsiness)  Sun Precautions  - Wear a wide brim hat for the next week if outside  - Wear a sunblock with zinc or titanium dioxide at least SPF 50 daily   We will recheck you in 10-12 weeks. If any problems, please call the office and ask to speak with a nurse.

## 2022-05-15 NOTE — Progress Notes (Unsigned)
Patient completed red light therapy today for training.   1. AK (actinic keratosis) Head - Anterior (Face)  Photodynamic therapy - Head - Anterior (Face) Procedure discussed: discussed risks, benefits, side effects. and alternatives   Prep: site scrubbed/prepped with acetone   Location:  Face Number of lesions:  Multiple Type of treatment:  Red light Aminolevulinic Acid (see MAR for details): Ameluz Amount of Ameluz (mg):  1 Incubation time (minutes):  60 Number of minutes under lamp:  20 Number of seconds under lamp:  0 Cooling:  Floor fan Outcome: patient tolerated procedure well with no complications   Post-procedure details: sunscreen applied    Johnsie Kindred, RMA

## 2022-05-24 ENCOUNTER — Encounter: Payer: Self-pay | Admitting: Dermatology

## 2022-07-08 NOTE — Progress Notes (Unsigned)
Electrophysiology Office Follow up Visit Note:    Date:  07/09/2022   ID:  Todd Lucas, DOB 05-17-1941, MRN [id number]  PCP:  [place] Pink, MD  [facility] Cardiologist:  None  [facility] Electrophysiologist:  Vickie Epley, MD    Interval History:    Todd Lucas is a 81 y.o. male who presents for a follow up visit. They were last seen in clinic 12/25/2021.   From my last visit: "Their medical history includes bicuspid aortic valve, paroxysmal atrial fibrillation post ablation in 2014, AV block post dual-chamber permanent pacemaker, dilated aortic root, iliac aneurysms bilaterally, hypertension.  The patient was last seen by Dr. Corky Sox November 06, 2021.  He continues to have symptomatic episodes of atrial fibrillation by history and device interrogation.  He experiences lightheadedness, dyspnea and poor exercise tolerance when in atrial fibrillation.  The patient is referred to discuss antiarrhythmic drugs versus repeat ablation."  Prior cryoablation in 2014.   He is with his wife today in clinic.    Past Medical History:  Diagnosis Date   Atrial fibrillation ([place])    Basal cell carcinoma 08/04/2018   right upper arm    Dysrhythmia    Heart murmur    Hx of basal cell carcinoma 05/26/2018   R upper arm. Excised 07/15/2018, margins free.   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   amoxicillin (AMOXIL) 500 MG tablet Take by mouth. Take 4 tablets  an hour before prior to dental procedures   apixaban (ELIQUIS) 5 MG TABS tablet Take 5 mg by mouth 2 (two) times daily.   levothyroxine (SYNTHROID) 88 MCG tablet Take 88 mcg by mouth every morning.   rosuvastatin (CRESTOR) 10 MG tablet Take 10 mg by mouth daily.   zolpidem (AMBIEN) 10 MG tablet Take 10 mg by mouth at bedtime as needed for sleep.     Allergies:   Patient has no known allergies.   Social History   Socioeconomic History   Marital status: Married    Spouse name: Not on file   Number of children: Not on file   Years of education: Not on file   Highest education level: Not on file  Occupational History   Not on file  Tobacco Use   Smoking status: Former    Types: Cigarettes   Smokeless tobacco: Never  Vaping Use   Vaping Use: Never used  Substance and Sexual Activity   Alcohol use: Yes    Alcohol/week: 2.0 standard drinks of alcohol    Types: 2 Glasses of wine per week    Comment: nightly   Drug use: Never   Sexual activity: Not on file  Other Topics Concern   Not on file  Social History Narrative   Not on file   Social Determinants of Health   Financial Resource Strain: Not on file  Food Insecurity: Not on file  Transportation Needs: Not on  file  Physical Activity: Not on file  Stress: Not on file  Social Connections: Not on file     Family History: The patient's family history is not on file.  ROS:   Please see the history of present illness.    All other systems reviewed and are negative.  EKGs/Labs/Other Studies Reviewed:    The following studies were reviewed today:  07/09/2022 in clinic device interrogaiton personally reviewed Estimated battery longevity 4 months Lead parameters stable A-fib burden 2.3% Longest episode of atrial fibrillation 46 hours on September 16 Ventricular pacing approximately 75%   02/25/2022 echo EF 60 RV normal BAV, no AS   Recent Labs: No results found for requested labs within last 365 days.  Recent  Lipid Panel No results found for: "CHOL", "TRIG", "HDL", "CHOLHDL", "VLDL", "LDLCALC", "LDLDIRECT"  Physical Exam:    VS:  BP 122/74 (BP Location: Right Arm, Patient Position: Sitting, Cuff Size: Normal)   Pulse (!) 59   Ht '6\' 1"'$  (1.854 m)   Wt 166 lb 12.8 oz (75.7 kg)   SpO2 97%   BMI 22.01 kg/m     Wt Readings from Last 3 Encounters:  07/09/22 166 lb 12.8 oz (75.7 kg)  12/25/21 163 lb (73.9 kg)  09/30/21 160 lb (72.6 kg)     GEN:  Well nourished, well developed in no acute distress HEENT: Normal NECK: No JVD; No carotid bruits LYMPHATICS: No lymphadenopathy CARDIAC: RRR, no murmurs, rubs, gallops.  Prepectoral pocket well-healed RESPIRATORY:  Clear to auscultation without rales, wheezing or rhonchi  ABDOMEN: Soft, non-tender, non-distended MUSCULOSKELETAL:  No edema; No deformity  SKIN: Warm and dry NEUROLOGIC:  Alert and oriented x 3 PSYCHIATRIC:  Normal affect        ASSESSMENT:    1. Paroxysmal atrial fibrillation (HCC)   2. Cardiac pacemaker in situ    PLAN:    In order of problems listed above:   #Paroxysmal AF On eliquis for stroke ppx. Discussed his atrial fibrillation burden during today's clinic appointment.  Given the low burden and upcoming need for generator replacement, we will continue with a watchful waiting approach.  If his atrial fibrillation burden trends upwards, could consider redo catheter ablation after he has had time to heal from his generator replacement.  #PPM in situ Device functioning appropriately, continue remote monitoring Device with 4 months battery longevity estimated.  Discussed the generator replacement procedure in detail during today's clinic appointment.  When his device reaches ERI, can proceed with scheduling.  He will need to stop his Xarelto for 3 days prior to the generator replacement with plans to restart it 5 days following the replacement.  Risks, benefits, and alternatives to pulse generator replacement were  discussed in detail today.  The patient understands that risks include but are not limited to bleeding, infection, pneumothorax, perforation, tamponade, vascular damage, renal failure, MI, stroke, death, damage to his existing leads, and lead dislodgement and wishes to proceed.  We will therefore schedule the procedure at the next available time.   #BAV No AS on last echo  Follow-up 6 months with APP    Medication Adjustments/Labs and Tests Ordered: Current medicines are reviewed at length with the patient today.  Concerns regarding medicines are outlined above.  No orders of the defined types were placed in this encounter.  No orders of the defined types were placed in this encounter.    Signed, Lars Mage, MD, [place], [facility] 07/09/2022 8:43 PM    Electrophysiology [facility]  HeartCare

## 2022-07-09 ENCOUNTER — Encounter: Payer: Self-pay | Admitting: Cardiology

## 2022-07-09 ENCOUNTER — Ambulatory Visit: Payer: Medicare Other | Attending: Cardiology | Admitting: Cardiology

## 2022-07-09 VITALS — BP 122/74 | HR 59 | Ht 73.0 in | Wt 166.8 lb

## 2022-07-09 DIAGNOSIS — Z950 Presence of cardiac pacemaker: Secondary | ICD-10-CM | POA: Diagnosis present

## 2022-07-09 DIAGNOSIS — I480 Paroxysmal atrial fibrillation: Secondary | ICD-10-CM | POA: Diagnosis present

## 2022-07-09 LAB — PACEMAKER DEVICE OBSERVATION

## 2022-07-09 NOTE — Patient Instructions (Signed)
Medication Instructions:  Your physician recommends that you continue on your current medications as directed. Please refer to the Current Medication list given to you today.  *If you need a refill on your cardiac medications before your next appointment, please call your pharmacy*  Follow-Up: At [facility], you and your health needs are our priority.  As part of our continuing mission to provide you with exceptional heart care, we have created designated Provider Care Teams.  These Care Teams include your primary Cardiologist (physician) and Advanced Practice Providers (APPs -  Physician Assistants and Nurse Practitioners) who all work together to provide you with the care you need, when you need it.  Your next appointment:   6 month(s)  The format for your next appointment:   In Person  Provider:   You may see Vickie Epley, MD or one of the following Advanced Practice Providers on your designated Care Team:   Tommye Standard, [place] Legrand Como "Jonni Sanger" Chalmers Cater, [place]   Other Instructions Once we receive an alert that your battery is due to be changed we will call you to arrange this procedure.  Important Information About Sugar

## 2022-08-11 ENCOUNTER — Other Ambulatory Visit: Payer: Self-pay | Admitting: *Deleted

## 2022-08-11 ENCOUNTER — Telehealth: Payer: Self-pay | Admitting: Cardiology

## 2022-08-11 DIAGNOSIS — I480 Paroxysmal atrial fibrillation: Secondary | ICD-10-CM

## 2022-08-11 MED ORDER — APIXABAN 5 MG PO TABS: 5.0000 mg | ORAL_TABLET | Freq: Two times a day (BID) | ORAL | 1 refills | Status: DC

## 2022-08-11 NOTE — Telephone Encounter (Signed)
Prescription refill request for Eliquis received. Indication: Afib  Last office visit: 07/09/22 Quentin Ore)  Scr: 0.82 (11/12/21)  Age: 83 Weight: 75.7kg  Appropriate dose. Refill sent.

## 2022-08-11 NOTE — Telephone Encounter (Signed)
*  STAT* If patient is at the pharmacy, call can be transferred to refill team.   1. Which medications need to be refilled? (please list name of each medication and dose if known)  apixaban (ELIQUIS) 5 MG TABS tablet  2. Which pharmacy/location (including street and city if local pharmacy) is medication to be sent to?  [facility] - [place], [place] - [address]  3. Do they need a 30 day or 90 day supply? 90  

## 2022-08-11 NOTE — Telephone Encounter (Signed)
Refill sent.

## 2022-08-11 NOTE — Telephone Encounter (Signed)
Refill request

## 2022-09-03 ENCOUNTER — Encounter: Payer: PRIVATE HEALTH INSURANCE | Admitting: Dermatology

## 2022-09-17 ENCOUNTER — Ambulatory Visit: Payer: Medicare Other | Admitting: Dermatology

## 2022-09-17 VITALS — BP 112/81 | HR 66

## 2022-09-17 DIAGNOSIS — L821 Other seborrheic keratosis: Secondary | ICD-10-CM

## 2022-09-17 DIAGNOSIS — D229 Melanocytic nevi, unspecified: Secondary | ICD-10-CM

## 2022-09-17 DIAGNOSIS — Z1283 Encounter for screening for malignant neoplasm of skin: Secondary | ICD-10-CM | POA: Diagnosis not present

## 2022-09-17 DIAGNOSIS — Z85828 Personal history of other malignant neoplasm of skin: Secondary | ICD-10-CM | POA: Diagnosis not present

## 2022-09-17 DIAGNOSIS — L814 Other melanin hyperpigmentation: Secondary | ICD-10-CM

## 2022-09-17 DIAGNOSIS — C4441 Basal cell carcinoma of skin of scalp and neck: Secondary | ICD-10-CM | POA: Diagnosis not present

## 2022-09-17 DIAGNOSIS — L578 Other skin changes due to chronic exposure to nonionizing radiation: Secondary | ICD-10-CM

## 2022-09-17 DIAGNOSIS — L570 Actinic keratosis: Secondary | ICD-10-CM | POA: Diagnosis not present

## 2022-09-17 DIAGNOSIS — D485 Neoplasm of uncertain behavior of skin: Secondary | ICD-10-CM

## 2022-09-17 HISTORY — DX: Actinic keratosis: L57.0

## 2022-09-17 NOTE — Progress Notes (Unsigned)
Follow-Up Visit   Subjective  Todd Lucas is a 82 y.o. male who presents for the following: Annual Exam (Hx BCC, SCC, AKs). The patient presents for Total-Body Skin Exam (TBSE) for skin cancer screening and mole check.  The patient has spots, moles and lesions to be evaluated, some may be new or changing.  The following portions of the chart were reviewed this encounter and updated as appropriate:   Tobacco  Allergies  Meds  Problems  Med Hx  Surg Hx  Fam Hx      Review of Systems:  No other skin or systemic complaints except as noted in HPI or Assessment and Plan.  Objective  Well appearing patient in no apparent distress; mood and affect are within normal limits.  A full examination was performed including scalp, head, eyes, ears, nose, lips, neck, chest, axillae, abdomen, back, buttocks, bilateral upper extremities, bilateral lower extremities, hands, feet, fingers, toes, fingernails, and toenails. All findings within normal limits unless otherwise noted below.  R vertex scalp x 2, mid back x 1, R cheek x 3, R anti helix x 1, L forehead above brow x 1, L nose x 1, L cheek x 2 (11) Erythematous thin papules/macules with gritty scale.   Mid ant vertex scalp 0.7 cm hyperkeratotic papule.      R post neck 0.3 cm pink/brown papule.        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  Actinic keratoses are precancerous spots that appear secondary to cumulative UV radiation exposure/sun exposure over time. They are chronic with expected duration over 1 year. A portion of actinic keratoses will progress to squamous cell carcinoma of the skin. It is not possible to reliably predict which spots will progress to skin cancer and so treatment is recommended to prevent development of skin  cancer.  Recommend daily broad spectrum sunscreen SPF 30+ to sun-exposed areas, reapply every 2 hours as needed.  Recommend staying in the shade or wearing long sleeves, sun glasses (UVA+UVB protection) and wide brim hats (4-inch brim around the entire circumference of the hat). Call for new or changing lesions.  Prior to procedure, discussed risks of blister formation, small wound, skin dyspigmentation, or rare scar following cryotherapy. Recommend Vaseline ointment to treated areas while healing.   Destruction of lesion - R vertex scalp x 2, mid back x 1, R cheek x 3, R anti helix x 1, L forehead above brow x 1, L nose x 1, L cheek x 2 Complexity: simple   Destruction method: cryotherapy   Informed consent: discussed and consent obtained   Timeout:  patient name, date of birth, surgical site, and procedure verified Lesion destroyed using liquid nitrogen: Yes   Region frozen until ice ball extended beyond lesion: Yes   Outcome: patient tolerated procedure well with no complications   Post-procedure details: wound care instructions given    Neoplasm of uncertain behavior of skin (2) Mid ant vertex scalp  Skin / nail biopsy Type of biopsy: tangential   Informed consent: discussed and consent obtained   Timeout: patient name, date of birth, surgical site, and procedure verified   Procedure prep:  Patient was prepped and draped in usual sterile fashion Prep type:  Isopropyl alcohol  Anesthesia: the lesion was anesthetized in a standard fashion   Anesthetic:  1% lidocaine w/ epinephrine 1-100,000 buffered w/ 8.4% NaHCO3 Instrument used: flexible razor blade   Hemostasis achieved with: pressure, aluminum chloride and electrodesiccation   Outcome: patient tolerated procedure well   Post-procedure details: sterile dressing applied and wound care instructions given   Dressing type: bandage and petrolatum    Specimen 1 - Surgical pathology Differential Diagnosis: D48.5 r/o AK vs SCC vs  other  Check Margins: No  R post neck  Epidermal / dermal shaving  Lesion diameter (cm):  0.3 Informed consent: discussed and consent obtained   Timeout: patient name, date of birth, surgical site, and procedure verified   Procedure prep:  Patient was prepped and draped in usual sterile fashion Prep type:  Isopropyl alcohol Anesthesia: the lesion was anesthetized in a standard fashion   Anesthetic:  1% lidocaine w/ epinephrine 1-100,000 buffered w/ 8.4% NaHCO3 Instrument used: flexible razor blade   Hemostasis achieved with: pressure, aluminum chloride and electrodesiccation   Outcome: patient tolerated procedure well   Post-procedure details: sterile dressing applied and wound care instructions given   Dressing type: bandage and petrolatum    Destruction of lesion Complexity: extensive   Destruction method: electrodesiccation and curettage   Informed consent: discussed and consent obtained   Timeout:  patient name, date of birth, surgical site, and procedure verified Procedure prep:  Patient was prepped and draped in usual sterile fashion Prep type:  Isopropyl alcohol Anesthesia: the lesion was anesthetized in a standard fashion   Anesthetic:  1% lidocaine w/ epinephrine 1-100,000 buffered w/ 8.4% NaHCO3 Curettage performed in three different directions: Yes   Electrodesiccation performed over the curetted area: Yes   Curettage cycles:  3 Lesion length (cm):  0.3 Lesion width (cm):  0.3 Final wound size (cm):  0.7 Hemostasis achieved with:  pressure, aluminum chloride and electrodesiccation Outcome: patient tolerated procedure well with no complications   Post-procedure details: sterile dressing applied and wound care instructions given   Dressing type: bandage and petrolatum    Specimen 2 - Surgical pathology Differential Diagnosis: D48.5 r/o BCC ED&C today  Check Margins: No   Lentigines - Scattered tan macules - Due to sun exposure - Benign-appearing, observe -  Recommend daily broad spectrum sunscreen SPF 30+ to sun-exposed areas, reapply every 2 hours as needed. - Call for any changes  Seborrheic Keratoses - Stuck-on, waxy, tan-brown papules and/or plaques  - Benign-appearing - Discussed benign etiology and prognosis. - Observe - Call for any changes  Melanocytic Nevi - Tan-brown and/or pink-flesh-colored symmetric macules and papules - Benign appearing on exam today - Observation - Call clinic for new or changing moles - Recommend daily use of broad spectrum spf 30+ sunscreen to sun-exposed areas.   Hemangiomas - Red papules - Discussed benign nature - Observe - Call for any changes  Actinic Damage - Chronic condition, secondary to cumulative UV/sun exposure - diffuse scaly erythematous macules with underlying dyspigmentation - Recommend daily broad spectrum sunscreen SPF 30+ to sun-exposed areas, reapply every 2 hours as needed.  - Staying in the shade or wearing long sleeves, sun glasses (UVA+UVB protection) and wide brim hats (4-inch brim around the entire circumference of the hat) are also recommended for sun protection.  - Call for new or changing lesions.  History of Basal Cell Carcinoma of the Skin - No evidence of recurrence today - Recommend regular full body skin exams - Recommend daily broad spectrum sunscreen SPF 30+ to sun-exposed  areas, reapply every 2 hours as needed.  - Call if any new or changing lesions are noted between office visits  Skin cancer screening performed today.  Return in about 6 weeks (around 10/29/2022) for AK follow up and biopsy f/u - OK to schedule at 12:10 or 12:20 if no openings.  Luther Redo, CMA, am acting as scribe for Forest Gleason, MD .  Documentation: I have reviewed the above documentation for accuracy and completeness, and I agree with the above.  Forest Gleason, MD

## 2022-09-17 NOTE — Patient Instructions (Addendum)
Electrodesiccation and Curettage ("Scrape and Burn") Wound Care Instructions  Leave the original bandage on for 24 hours if possible.  If the bandage becomes soaked or soiled before that time, it is OK to remove it and examine the wound.  A small amount of post-operative bleeding is normal.  If excessive bleeding occurs, remove the bandage, place gauze over the site and apply continuous pressure (no peeking) over the area for 30 minutes. If this does not work, please call our clinic as soon as possible or page your doctor if it is after hours.   Once a day, cleanse the wound with soap and water. It is fine to shower. If a thick crust develops you may use a Q-tip dipped into dilute hydrogen peroxide (mix 1:1 with water) to dissolve it.  Hydrogen peroxide can slow the healing process, so use it only as needed.    After washing, apply petroleum jelly (Vaseline) or an antibiotic ointment if your doctor prescribed one for you, followed by a bandage.    For best healing, the wound should be covered with a layer of ointment at all times. If you are not able to keep the area covered with a bandage to hold the ointment in place, this may mean re-applying the ointment several times a day.  Continue this wound care until the wound has healed and is no longer open. It may take several weeks for the wound to heal and close.  Itching and mild discomfort is normal during the healing process.  If you have any discomfort, you can take Tylenol (acetaminophen) or ibuprofen as directed on the bottle. (Please do not take these if you have an allergy to them or cannot take them for another reason).  Some redness, tenderness and white or yellow material in the wound is normal healing.  If the area becomes very sore and red, or develops a thick yellow-green material (pus), it may be infected; please notify us.    Wound healing continues for up to one year following surgery. It is not unusual to experience pain in the scar  from time to time during the interval.  If the pain becomes severe or the scar thickens, you should notify the office.    A slight amount of redness in a scar is expected for the first six months.  After six months, the redness will fade and the scar will soften and fade.  The color difference becomes less noticeable with time.  If there are any problems, return for a post-op surgery check at your earliest convenience.  To improve the appearance of the scar, you can use silicone scar gel, cream, or sheets (such as Mederma or Serica) every night for up to one year. These are available over the counter (without a prescription).  Please call our office at [phone] for any questions or concerns.     Due to recent changes in healthcare laws, you may see results of your pathology and/or laboratory studies on MyChart before the doctors have had a chance to review them. We understand that in some cases there may be results that are confusing or concerning to you. Please understand that not all results are received at the same time and often the doctors may need to interpret multiple results in order to provide you with the best plan of care or course of treatment. Therefore, we ask that you please give us 2 business days to thoroughly review all your results before contacting the office for clarification. Should   we see a critical lab result, you will be contacted sooner.   If You Need Anything After Your Visit  If you have any questions or concerns for your doctor, please call our main line at [phone] and press option 4 to reach your doctor's medical assistant. If no one answers, please leave a voicemail as directed and we will return your call as soon as possible. Messages left after 4 pm will be answered the following business day.   You may also send us a message via MyChart. We typically respond to MyChart messages within 1-2 business days.  For prescription refills, please ask your  pharmacy to contact our office. Our fax number is [phone].  If you have an urgent issue when the clinic is closed that cannot wait until the next business day, you can page your doctor at the number below.    Please note that while we do our best to be available for urgent issues outside of office hours, we are not available 24/7.   If you have an urgent issue and are unable to reach us, you may choose to seek medical care at your doctor's office, retail clinic, urgent care center, or emergency room.  If you have a medical emergency, please immediately call 911 or go to the emergency department.  Pager Numbers  - Dr. Kowalski: [phone]  - Dr. Moye: [phone]  - Dr. Stewart: [phone]  In the event of inclement weather, please call our main line at [phone] for an update on the status of any delays or closures.  Dermatology Medication Tips: Please keep the boxes that topical medications come in in order to help keep track of the instructions about where and how to use these. Pharmacies typically print the medication instructions only on the boxes and not directly on the medication tubes.   If your medication is too expensive, please contact our office at [phone] option 4 or send us a message through MyChart.   We are unable to tell what your co-pay for medications will be in advance as this is different depending on your insurance coverage. However, we may be able to find a substitute medication at lower cost or fill out paperwork to get insurance to cover a needed medication.   If a prior authorization is required to get your medication covered by your insurance company, please allow us 1-2 business days to complete this process.  Drug prices often vary depending on where the prescription is filled and some pharmacies may offer cheaper prices.  The website www.goodrx.com contains coupons for medications through different pharmacies. The prices here do not  account for what the cost may be with help from insurance (it may be cheaper with your insurance), but the website can give you the price if you did not use any insurance.  - You can print the associated coupon and take it with your prescription to the pharmacy.  - You may also stop by our office during regular business hours and pick up a GoodRx coupon card.  - If you need your prescription sent electronically to a different pharmacy, notify our office through [facility] MyChart or by phone at [phone] option 4.     Si Usted Necesita Algo Despus de Su Visita  Tambin puede enviarnos un mensaje a travs de MyChart. Por lo general respondemos a los mensajes de MyChart en el transcurso de 1 a 2 das hbiles.  Para renovar recetas, por favor pida a su farmacia que se ponga en   contacto con nuestra oficina. Nuestro nmero de fax es el [phone].  Si tiene un asunto urgente cuando la clnica est cerrada y que no puede esperar hasta el siguiente da hbil, puede llamar/localizar a su doctor(a) al nmero que aparece a continuacin.   Por favor, tenga en cuenta que aunque hacemos todo lo posible para estar disponibles para asuntos urgentes fuera del horario de oficina, no estamos disponibles las 24 horas del da, los 7 das de la semana.   Si tiene un problema urgente y no puede comunicarse con nosotros, puede optar por buscar atencin mdica  en el consultorio de su doctor(a), en una clnica privada, en un centro de atencin urgente o en una sala de emergencias.  Si tiene una emergencia mdica, por favor llame inmediatamente al 911 o vaya a la sala de emergencias.  Nmeros de bper  - Dr. Kowalski: [phone]  - Dra. Moye: [phone]  - Dra. Stewart: [phone]  En caso de inclemencias del tiempo, por favor llame a nuestra lnea principal al [phone] para una actualizacin sobre el estado de cualquier retraso o cierre.  Consejos para la medicacin en dermatologa: Por  favor, guarde las cajas en las que vienen los medicamentos de uso tpico para ayudarle a seguir las instrucciones sobre dnde y cmo usarlos. Las farmacias generalmente imprimen las instrucciones del medicamento slo en las cajas y no directamente en los tubos del medicamento.   Si su medicamento es muy caro, por favor, pngase en contacto con nuestra oficina llamando al [phone] y presione la opcin 4 o envenos un mensaje a travs de MyChart.   No podemos decirle cul ser su copago por los medicamentos por adelantado ya que esto es diferente dependiendo de la cobertura de su seguro. Sin embargo, es posible que podamos encontrar un medicamento sustituto a menor costo o llenar un formulario para que el seguro cubra el medicamento que se considera necesario.   Si se requiere una autorizacin previa para que su compaa de seguros cubra su medicamento, por favor permtanos de 1 a 2 das hbiles para completar este proceso.  Los precios de los medicamentos varan con frecuencia dependiendo del lugar de dnde se surte la receta y alguna farmacias pueden ofrecer precios ms baratos.  El sitio web www.goodrx.com tiene cupones para medicamentos de diferentes farmacias. Los precios aqu no tienen en cuenta lo que podra costar con la ayuda del seguro (puede ser ms barato con su seguro), pero el sitio web puede darle el precio si no utiliz ningn seguro.  - Puede imprimir el cupn correspondiente y llevarlo con su receta a la farmacia.  - Tambin puede pasar por nuestra oficina durante el horario de atencin regular y recoger una tarjeta de cupones de GoodRx.  - Si necesita que su receta se enve electrnicamente a una farmacia diferente, informe a nuestra oficina a travs de MyChart de [place] o por telfono llamando al [phone] y presione la opcin 4.  

## 2022-09-18 ENCOUNTER — Other Ambulatory Visit: Payer: Self-pay

## 2022-09-18 ENCOUNTER — Other Ambulatory Visit
Admission: RE | Admit: 2022-09-18 | Discharge: 2022-09-18 | Disposition: A | Discharge status: Home / Self Care | Payer: Medicare Other | Attending: Cardiology | Admitting: Cardiology

## 2022-09-18 ENCOUNTER — Telehealth: Payer: Self-pay | Admitting: Cardiology

## 2022-09-18 DIAGNOSIS — I480 Paroxysmal atrial fibrillation: Secondary | ICD-10-CM | POA: Diagnosis present

## 2022-09-18 LAB — CBC
HCT: 44 % (ref 39.0–52.0)
Hemoglobin: 14.7 g/dL (ref 13.0–17.0)
MCH: 32.5 pg (ref 26.0–34.0)
MCHC: 33.4 g/dL (ref 30.0–36.0)
MCV: 97.3 fL (ref 80.0–100.0)
Platelets: 179 10*3/uL (ref 150–400)
RBC: 4.52 MIL/uL (ref 4.22–5.81)
RDW: 12.9 % (ref 11.5–15.5)
WBC: 3.8 10*3/uL — ABNORMAL LOW (ref 4.0–10.5)
nRBC: 0 % (ref 0.0–0.2)

## 2022-09-18 LAB — BASIC METABOLIC PANEL
Anion gap: 7 (ref 5–15)
BUN: 14 mg/dL (ref 8–23)
CO2: 28 mmol/L (ref 22–32)
Calcium: 9 mg/dL (ref 8.9–10.3)
Chloride: 103 mmol/L (ref 98–111)
Creatinine, Ser: 0.82 mg/dL (ref 0.61–1.24)
GFR, Estimated: >60 mL/min (ref >60)
Glucose, Bld: 110 mg/dL — ABNORMAL HIGH (ref 70–99)
Potassium: 4.4 mmol/L (ref 3.5–5.1)
Sodium: 138 mmol/L (ref 135–145)

## 2022-09-18 NOTE — Telephone Encounter (Signed)
Pt is scheduled for 09/22/22 @ 6:00 pm. He will go to [facility] today 3/7 for labs. He is aware to hold his Eliquis x 3 days. He has scrub at home.  Instruction letter sent via MyChart and he is aware of how to find it.

## 2022-09-18 NOTE — Telephone Encounter (Signed)
Returned call to Pt.  They report that Pt's heart rate is now 65 bpm and he does not feel well.  Pt has an Adapta that was nearing ERI at last office visit with CL in December 2023.  Requested remote transmission, but advised because of change of heart rate Pt is probably at [facility] and switched to VVI 65.  Confirmed with receipt of manual transmission.  Discussed with CL-ok to schedule gen change September 22, 2022.  Sent to scheduler.

## 2022-09-18 NOTE — Telephone Encounter (Signed)
  1. Has your device fired? no  2. Is you device beeping? no  3. Are you experiencing draining or swelling at device site?   4. Are you calling to see if we received your device transmission?   5. Have you passed out? No- having problems with his device   Please route to [place]

## 2022-09-19 ENCOUNTER — Encounter: Payer: Self-pay | Admitting: Cardiology

## 2022-09-19 NOTE — Pre-Procedure Instructions (Signed)
Attempted to call patient regarding procedure instructions.  Left voicemail on the following items: Arrival time 1230 Nothing to eat or drink after midnight No meds AM of procedure Responsible person to drive you home and stay with you for 24 hrs Wash with special soap night before and morning of procedure If on anti-coagulant drug instructions Eliquis- last dose should have been 3/7

## 2022-09-20 ENCOUNTER — Encounter: Payer: Self-pay | Admitting: Dermatology

## 2022-09-22 ENCOUNTER — Other Ambulatory Visit: Payer: Self-pay

## 2022-09-22 ENCOUNTER — Ambulatory Visit (HOSPITAL_COMMUNITY)
Admission: RE | Admit: 2022-09-22 | Discharge: 2022-09-22 | Disposition: A | Discharge status: Home / Self Care | Payer: Medicare Other | Attending: Cardiology | Admitting: Cardiology

## 2022-09-22 ENCOUNTER — Encounter (HOSPITAL_COMMUNITY)
Admission: RE | Disposition: A | Discharge status: Home / Self Care | Payer: Self-pay | Source: Home / Self Care | Attending: Cardiology

## 2022-09-22 DIAGNOSIS — I442 Atrioventricular block, complete: Secondary | ICD-10-CM

## 2022-09-22 DIAGNOSIS — Z45010 Encounter for checking and testing of cardiac pacemaker pulse generator [battery]: Secondary | ICD-10-CM | POA: Diagnosis not present

## 2022-09-22 DIAGNOSIS — I10 Essential (primary) hypertension: Secondary | ICD-10-CM | POA: Diagnosis not present

## 2022-09-22 DIAGNOSIS — Z7901 Long term (current) use of anticoagulants: Secondary | ICD-10-CM | POA: Insufficient documentation

## 2022-09-22 DIAGNOSIS — I480 Paroxysmal atrial fibrillation: Secondary | ICD-10-CM

## 2022-09-22 HISTORY — PX: PPM GENERATOR CHANGEOUT: EP1233

## 2022-09-22 SURGERY — PPM GENERATOR CHANGEOUT

## 2022-09-22 MED ORDER — CEFAZOLIN SODIUM-DEXTROSE 2-4 GM/100ML-% IV SOLN
2.0000 g | INTRAVENOUS | Status: AC
  Administered 2022-09-22: 2 g via INTRAVENOUS

## 2022-09-22 MED ORDER — MIDAZOLAM HCL 5 MG/5ML IJ SOLN
INTRAMUSCULAR | Status: AC
  Filled 2022-09-22: qty 5

## 2022-09-22 MED ORDER — FENTANYL CITRATE (PF) 100 MCG/2ML IJ SOLN
INTRAMUSCULAR | Status: DC | PRN
  Administered 2022-09-22 (×2): 25 ug via INTRAVENOUS

## 2022-09-22 MED ORDER — LIDOCAINE HCL (PF) 1 % IJ SOLN
INTRAMUSCULAR | Status: DC | PRN
  Administered 2022-09-22: 60 mL via INTRADERMAL

## 2022-09-22 MED ORDER — CEFAZOLIN SODIUM-DEXTROSE 2-4 GM/100ML-% IV SOLN
INTRAVENOUS | Status: AC
  Filled 2022-09-22: qty 100

## 2022-09-22 MED ORDER — FENTANYL CITRATE (PF) 100 MCG/2ML IJ SOLN
INTRAMUSCULAR | Status: AC
  Filled 2022-09-22: qty 2

## 2022-09-22 MED ORDER — POVIDONE-IODINE 10 % EX SWAB
2.0000 | Freq: Once | CUTANEOUS | Status: AC
  Administered 2022-09-22: 2 via TOPICAL

## 2022-09-22 MED ORDER — SODIUM CHLORIDE 0.9 % IV SOLN
INTRAVENOUS | Status: AC
  Filled 2022-09-22: qty 2

## 2022-09-22 MED ORDER — MIDAZOLAM HCL 5 MG/5ML IJ SOLN
INTRAMUSCULAR | Status: DC | PRN
  Administered 2022-09-22 (×2): 1 mg via INTRAVENOUS

## 2022-09-22 MED ORDER — LIDOCAINE HCL (PF) 1 % IJ SOLN
INTRAMUSCULAR | Status: AC
  Filled 2022-09-22: qty 60

## 2022-09-22 MED ORDER — SODIUM CHLORIDE 0.9 % IV SOLN: INTRAVENOUS | Status: DC

## 2022-09-22 MED ORDER — ACETAMINOPHEN 325 MG PO TABS: 325.0000 mg | ORAL_TABLET | ORAL | Status: DC | PRN

## 2022-09-22 MED ORDER — CHLORHEXIDINE GLUCONATE 4 % EX LIQD: 4.0000 | Freq: Once | CUTANEOUS | Status: DC

## 2022-09-22 MED ORDER — APIXABAN 5 MG PO TABS: 5.0000 mg | ORAL_TABLET | Freq: Two times a day (BID) | ORAL | 1 refills | Status: DC

## 2022-09-22 MED ORDER — ONDANSETRON HCL 4 MG/2ML IJ SOLN: 4.0000 mg | Freq: Four times a day (QID) | INTRAMUSCULAR | Status: DC | PRN

## 2022-09-22 MED ORDER — SODIUM CHLORIDE 0.9 % IV SOLN
80.0000 mg | INTRAVENOUS | Status: AC
  Administered 2022-09-22: 80 mg

## 2022-09-22 SURGICAL SUPPLY — 7 items
CABLE SURGICAL S-101-97-12 (CABLE) ×1 IMPLANT
IPG PACE AZUR XT DR MRI W1DR01 (Pacemaker) IMPLANT
PACE AZURE XT DR MRI W1DR01 (Pacemaker) ×1 IMPLANT
PAD DEFIB RADIO PHYSIO CONN (PAD) ×1 IMPLANT
POUCH AIGIS-R ANTIBACT PPM (Mesh General) ×1 IMPLANT
POUCH AIGIS-R ANTIBACT PPM MED (Mesh General) IMPLANT
TRAY PACEMAKER INSERTION (PACKS) ×1 IMPLANT

## 2022-09-22 NOTE — H&P (Signed)
Electrophysiology Office Follow up Visit Note:     Date:  09/22/2022    ID:  Todd Lucas, DOB 1941-03-13, MRN [id number]   PCP:  [place] Pink, MD      [facility] Cardiologist:  None  [facility] Electrophysiologist:  Vickie Epley, MD      Interval History:     Todd Lucas is a 82 y.o. male who presents for a follow up visit. They were last seen in clinic 12/25/2021.    From my last visit: "Their medical history includes bicuspid aortic valve, paroxysmal atrial fibrillation post ablation in 2014, AV block post dual-chamber permanent pacemaker, dilated aortic root, iliac aneurysms bilaterally, hypertension.  The patient was last seen by Dr. Corky Sox November 06, 2021.  He continues to have symptomatic episodes of atrial fibrillation by history and device interrogation.  He experiences lightheadedness, dyspnea and poor exercise tolerance when in atrial fibrillation.  The patient is referred to discuss antiarrhythmic drugs versus repeat ablation."   Prior cryoablation in 2014.    He is with his wife today in clinic.  Presents for generator replacement.    Objective      Past Medical History:  Diagnosis Date   Atrial fibrillation ([place])     Basal cell carcinoma 08/04/2018    right upper arm    Dysrhythmia     Heart murmur     Hx of basal cell carcinoma 05/26/2018    R upper arm. Excised 07/15/2018, margins free.   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   amoxicillin (AMOXIL) 500 MG tablet Take by mouth. Take 4 tablets an hour before prior to dental procedures   apixaban (ELIQUIS) 5 MG TABS tablet Take 5 mg by mouth 2 (two) times daily.   levothyroxine (SYNTHROID) 88 MCG tablet Take 88 mcg by mouth every morning.   rosuvastatin (CRESTOR) 10 MG tablet Take 10 mg by mouth daily.   zolpidem (AMBIEN) 10 MG tablet Take 10 mg by mouth at bedtime as needed for sleep.        Allergies:   Patient has no known allergies.    Social History         Socioeconomic History   Marital status: Married      Spouse name: Not on file   Number of children: Not on file   Years of education: Not on file   Highest education level: Not on file  Occupational History   Not on file  Tobacco Use   Smoking status: Former      Types: Cigarettes   Smokeless tobacco: Never  Vaping Use   Vaping Use: Never used  Substance and Sexual Activity   Alcohol use: Yes      Alcohol/week: 2.0 standard drinks of alcohol  Types: 2 Glasses of wine per week      Comment: nightly   Drug use: Never   Sexual activity: Not on file  Other Topics Concern   Not on file  Social History Narrative   Not on file    Social Determinants of Health    Financial Resource Strain: Not on file  Food Insecurity: Not on file  Transportation Needs: Not on file  Physical Activity: Not on file  Stress: Not on file  Social Connections: Not on file      Family History: The patient's family history is not on file.   ROS:   Please see the history of present illness.    All other systems reviewed and are negative.   EKGs/Labs/Other Studies Reviewed:     The following studies were reviewed today:   07/09/2022 in clinic device interrogaiton personally reviewed Estimated battery longevity 4 months Lead parameters stable A-fib burden 2.3% Longest episode of atrial fibrillation 46 hours on September 16 Ventricular pacing  approximately 75%     02/25/2022 echo EF 60 RV normal BAV, no AS     Recent Labs: No results found for requested labs within last 365 days.  Recent Lipid Panel Labs (Brief)  No results found for: "CHOL", "TRIG", "HDL", "CHOLHDL", "VLDL", "LDLCALC", "LDLDIRECT"     Physical Exam:     VS:  BP 138/91 (BP Location: Right Arm, Patient Position: Sitting, Cuff Size: Normal)   Pulse 67   Ht '6\' 1"'$  (1.854 m)   Wt 166 lb 12.8 oz (75.7 kg)   SpO2 97%   BMI 22.01 kg/m         Wt Readings from Last 3 Encounters:  07/09/22 166 lb 12.8 oz (75.7 kg)  12/25/21 163 lb (73.9 kg)  09/30/21 160 lb (72.6 kg)      GEN:  Well nourished, well developed in no acute distress HEENT: Normal NECK: No JVD; No carotid bruits LYMPHATICS: No lymphadenopathy CARDIAC: RRR, no murmurs, rubs, gallops.  Prepectoral pocket well-healed RESPIRATORY:  Clear to auscultation without rales, wheezing or rhonchi  ABDOMEN: Soft, non-tender, non-distended MUSCULOSKELETAL:  No edema; No deformity  SKIN: Warm and dry NEUROLOGIC:  Alert and oriented x 3 PSYCHIATRIC:  Normal affect            Assessment ASSESSMENT:     1. Paroxysmal atrial fibrillation (HCC)   2. Cardiac pacemaker in situ     PLAN:     In order of problems listed above:     #Paroxysmal AF On eliquis for stroke ppx. Discussed his atrial fibrillation burden during today's clinic appointment.  Given the low burden and upcoming need for generator replacement, we will continue with a watchful waiting approach.  If his atrial fibrillation burden trends upwards, could consider redo catheter ablation after he has had time to heal from his generator replacement.   #PPM in situ Device functioning appropriately, continue remote monitoring Device with 4 months battery longevity estimated.  Discussed the generator replacement procedure in detail during today's clinic appointment.  When his device reaches ERI, can proceed with scheduling.  He will need  to stop his Xarelto for 3 days prior to the generator replacement with plans to restart it 5 days following the replacement.   Risks, benefits, and alternatives to pulse generator replacement were discussed in detail today.  The patient understands that risks include but are not limited to bleeding, infection, pneumothorax, perforation, tamponade, vascular damage, renal failure, MI, stroke, death, damage to his existing  leads, and lead dislodgement and wishes to proceed.  We will therefore schedule the procedure at the next available time.   Presents today for generator replacement. Procedure reviewed.         Signed, Lars Mage, MD, [facility] 09/22/2022 Electrophysiology [facility]

## 2022-09-22 NOTE — Discharge Instructions (Signed)

## 2022-09-23 ENCOUNTER — Encounter (HOSPITAL_COMMUNITY): Payer: Self-pay | Admitting: Cardiology

## 2022-09-24 ENCOUNTER — Telehealth: Payer: Self-pay

## 2022-09-24 ENCOUNTER — Encounter (HOSPITAL_COMMUNITY): Payer: Self-pay | Admitting: Cardiology

## 2022-09-24 NOTE — Telephone Encounter (Signed)
Called patient. N/A. LMOVM to C/B 

## 2022-09-24 NOTE — Telephone Encounter (Signed)
-----   Message from Alfonso Patten, MD sent at 09/24/2022  5:21 PM EDT ----- 1. 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 Skin , right post neck BASAL CELL CARCINOMA, NODULAR PATTERN --> already treated with [facility]  MAs please call. Thank you!

## 2022-09-25 ENCOUNTER — Telehealth: Payer: Self-pay

## 2022-09-25 NOTE — Telephone Encounter (Signed)
Left message.  Need to ensure transmission received. Check in on wound care.  Follow-up after same day discharge: Implant date: 09/22/2022 MD: Lars Mage, MD Device: MDT PPM   Wound check visit: 10/02/2022 at 10:00 am 90 day MD follow-up: scheduled  Remote Transmission received:remote appt scheduled-accepted into [facility]  Dressing/sling removed:

## 2022-09-25 NOTE — Telephone Encounter (Signed)
Call back received from Pt.  Per Pt wound site looks good.  He has connected remotely-transmission scheduled.  All questions answered.

## 2022-09-26 NOTE — Telephone Encounter (Signed)
This patient sent a transmission on 09/22/2022. The serial number is this interrogation matches the new device serial number. Sometimes when we accept the patient it will not show the recent transmission until the next day.

## 2022-09-26 NOTE — Telephone Encounter (Signed)
Pt has the app. He did turn the blutooth off on his phone earlier. That might be the reason it was saying disconnected. I did get another transmission from the patient 09/26/2022.

## 2022-09-29 NOTE — Telephone Encounter (Signed)
Remote received.  Pt has follow up this Thursday for wound check.  History documents AV block-but no identifiers concerning what type he was originally diagnosed with.

## 2022-10-02 ENCOUNTER — Ambulatory Visit: Payer: PRIVATE HEALTH INSURANCE

## 2022-10-02 ENCOUNTER — Telehealth: Payer: Self-pay

## 2022-10-02 NOTE — Telephone Encounter (Signed)
Advised pt of bx results/sh ?

## 2022-10-02 NOTE — Telephone Encounter (Signed)
-----   Message from Virginia Moye, MD sent at 09/24/2022  5:21 PM EDT ----- 1. 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 Skin , right post neck BASAL CELL CARCINOMA, NODULAR PATTERN --> already treated with [facility]  MAs please call. Thank you! 

## 2022-10-10 ENCOUNTER — Ambulatory Visit: Payer: Medicare Other | Attending: Physician Assistant | Admitting: Physician Assistant

## 2022-10-10 ENCOUNTER — Encounter: Payer: Self-pay | Admitting: Physician Assistant

## 2022-10-10 VITALS — BP 120/90 | HR 62 | Ht 73.0 in | Wt 168.8 lb

## 2022-10-10 DIAGNOSIS — Z950 Presence of cardiac pacemaker: Secondary | ICD-10-CM | POA: Diagnosis present

## 2022-10-10 DIAGNOSIS — I480 Paroxysmal atrial fibrillation: Secondary | ICD-10-CM | POA: Diagnosis present

## 2022-10-10 DIAGNOSIS — D6869 Other thrombophilia: Secondary | ICD-10-CM | POA: Insufficient documentation

## 2022-10-10 DIAGNOSIS — I10 Essential (primary) hypertension: Secondary | ICD-10-CM | POA: Diagnosis present

## 2022-10-10 LAB — CUP PACEART INCLINIC DEVICE CHECK
Battery Remaining Longevity: 140 mo
Battery Voltage: 3.21 V
Brady Statistic AP VP Percent: 47.46 %
Brady Statistic AP VS Percent: 0 %
Brady Statistic AS VP Percent: 52.53 %
Brady Statistic AS VS Percent: 0.01 %
Brady Statistic RA Percent Paced: 47.33 %
Brady Statistic RV Percent Paced: 99.99 %
Date Time Interrogation Session: 20240329164707
Implantable Pulse Generator Implant Date: 20240311
Lead Channel Impedance Value: 342 Ohm
Lead Channel Impedance Value: 418 Ohm
Lead Channel Impedance Value: 475 Ohm
Lead Channel Impedance Value: 608 Ohm
Lead Channel Pacing Threshold Amplitude: 0.625 V
Lead Channel Pacing Threshold Amplitude: 0.625 V
Lead Channel Pacing Threshold Pulse Width: 0.4 ms
Lead Channel Pacing Threshold Pulse Width: 0.4 ms
Lead Channel Sensing Intrinsic Amplitude: 0.875 mV
Lead Channel Sensing Intrinsic Amplitude: 1.625 mV
Lead Channel Sensing Intrinsic Amplitude: 31.625 mV
Lead Channel Setting Pacing Amplitude: 1.5 V
Lead Channel Setting Pacing Amplitude: 2.5 V
Lead Channel Setting Pacing Pulse Width: 0.4 ms
Lead Channel Setting Sensing Sensitivity: 1.2 mV
Zone Setting Status: 755011
Zone Setting Status: 755011

## 2022-10-10 NOTE — Patient Instructions (Signed)
Medication Instructions:   Your physician recommends that you continue on your current medications as directed. Please refer to the Current Medication list given to you today.  *If you need a refill on your cardiac medications before your next appointment, please call your pharmacy*   Lab Work: [place]   If you have labs (blood work) drawn today and your tests are completely normal, you will receive your results only by: [place] (if you have MyChart) OR A paper copy in the mail If you have any lab test that is abnormal or we need to change your treatment, we will call you to review the results.   Testing/Procedures: NONE ORDERED  TODAY    Follow-Up: At [facility], you and your health needs are our priority.  As part of our continuing mission to provide you with exceptional heart care, we have created designated Provider Care Teams.  These Care Teams include your primary Cardiologist (physician) and Advanced Practice Providers (APPs -  Physician Assistants and Nurse Practitioners) who all work together to provide you with the care you need, when you need it.  We recommend signing up for the patient portal called "MyChart".  Sign up information is provided on this After Visit Summary.  MyChart is used to connect with patients for Virtual Visits (Telemedicine).  Patients are able to view lab/test results, encounter notes, upcoming appointments, etc.  Non-urgent messages can be sent to your provider as well.   To learn more about what you can do with MyChart, go to [email or web address].    Your next appointment:  NEW PATIENT  WITH DR Rockey Situ    Other Instructions

## 2022-10-10 NOTE — Progress Notes (Signed)
Cardiology Office Note Date:  10/10/2022  Patient ID:  Todd Lucas 1941/03/21, MRN [id number] PCP:  [place] Pink, MD  Cardiologist:  Dr. Corky Sox ([facility]) >>> Dr. Rockey Situ (pending) Electrophysiologist: Dr. Quentin Ore      Chief Complaint: wound check  History of Present Illness: Todd Lucas is a 82 y.o. 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 Quentin Ore 07/09/22 to discuss AFib burden, rhythm management options.  Symptomatic with AFib though fairly low burden with discussion of repeat ablation if burden were to increase.  Planned to pursue gen change and monitor rhythm burden.    TODAY He is accompanied by his wife ( a Marine scientist) He feels well No CP, SOB No near syncope or syncope. No bleeding or signs of bleeding No healing concerns  His wife was recently hospitalized and they met Dr. Rockey Situ while there, they discussed with Dr. Rockey Situ if he would accept him as a patient as well, and was agreeable, so he would like to get a new patient visit wth him when able.    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 Nodular. EDC   Dysrhythmia    Heart murmur    Hx of basal cell carcinoma 05/26/2018   R upper arm. Excised 07/15/2018, margins free.   Hx of squamous cell carcinoma of skin 05/26/2018   L lateral calf   Loose stools     Past Surgical History:  Procedure Laterality Date   aneurysm artery iliac common     COLONOSCOPY WITH PROPOFOL N/A 09/30/2021   Procedure: COLONOSCOPY WITH PROPOFOL;  Surgeon: Lesly Rubenstein, MD;   Location: [facility] ENDOS[facility];  Service: Endoscopy;  Laterality: N/A;   heart ablation     INSERT / REPLACE / REMOVE PACEMAKER     PPM GENERATOR CHANGEOUT N/A 09/22/2022   Procedure: PPM GENERATOR CHANGEOUT;  Surgeon: Vickie Epley, MD;  Location: [facility];  Service: Cardiovascular;  Laterality: N/A;    Current Outpatient Medications  Medication Sig Dispense Refill   acetaminophen (TYLENOL) 500 MG tablet Take 1,000 mg by mouth every 8 (eight) hours as needed for moderate pain.     amoxicillin (AMOXIL) 500 MG tablet Take by mouth. Take 4 tablets an hour before prior to dental procedures     apixaban (ELIQUIS) 5 MG TABS tablet Take 1 tablet (5 mg total) by mouth 2 (two) times daily. 180 tablet 1   cyanocobalamin (VITAMIN B12) 1000 MCG tablet Take 1,000 mcg by mouth daily with supper.     diltiazem (CARDIZEM) 30 MG tablet Take 30 mg by mouth 2 (two) times daily as needed (afib).     levothyroxine (SYNTHROID) 88 MCG tablet Take 88 mcg by mouth every morning.     Polyethyl Glycol-Propyl Glycol (SYSTANE ULTRA) 0.4-0.3 % SOLN Place 1 drop into both eyes daily as needed (dry eyes).     rosuvastatin (CRESTOR) 10 MG tablet Take 10 mg by mouth at bedtime.     zolpidem (AMBIEN) 10 MG tablet Take 10 mg by mouth at bedtime as  needed for sleep.     No current facility-administered medications for this visit.    Allergies:   Patient has no known allergies.   Social History:  The patient  reports that he has quit smoking. His smoking use included cigarettes. He has never used smokeless tobacco. He reports current alcohol use of about 2.0 standard drinks of alcohol per week. He reports that he does not use drugs.   Family History:  The patient's family history is not on file.  ROS:  Please see the history of present illness.    All other systems are reviewed and otherwise negative.   PHYSICAL EXAM:  VS:  There were no vitals taken for this visit. BMI: There is no height or weight on file to  calculate BMI. Well nourished, well developed, in no acute distress HEENT: normocephalic, atraumatic Neck: no JVD, carotid bruits or masses Cardiac:  RRR; no significant murmurs, no rubs, or gallops Lungs:  CTA b/l, no wheezing, rhonchi or rales Abd: soft, nontender MS: no deformity or atrophy Ext: no edema Skin: warm and dry, no rash Neuro:  No gross deficits appreciated Psych: euthymic mood, full affect  PPM steri strips are removed without difficulty, wound edges are well approximated, no erythema, edema, no drainage, bleeding or hematoma. Site is site is stable, no tethering or discomfort   EKG:  not done today  Device interrogation done today and reviewed by myself:  Battery and lead measurements are good He has intermittent conduction today though with LOC during threshold testing, no escape beats came in Left base pacing mode and delays without change RV output reduced to 2.5V No other programming changes were made.    02/25/22: TTE 1. Left ventricular ejection fraction, by estimation, is 60 to 65%. The  left ventricle has normal function. The left ventricle has no regional  wall motion abnormalities. There is mild left ventricular hypertrophy.  Left ventricular diastolic parameters  are consistent with Grade II diastolic dysfunction (pseudonormalization).   2. Right ventricular systolic function is normal. The right ventricular  size is moderately enlarged. Tricuspid regurgitation signal is inadequate  for assessing PA pressure.   3. Left atrial size was mildly dilated.   4. The mitral valve is normal in structure. Mild mitral valve  regurgitation. No evidence of mitral stenosis.   5. The aortic valve is bicuspid. There is mild calcification of the  aortic valve. Aortic valve regurgitation is not visualized. Aortic valve  sclerosis is present, with no evidence of aortic valve stenosis.   6. There is mild dilatation of the ascending aorta, measuring 42 mm.   7. The  inferior vena cava is normal in size with greater than 50%  respiratory variability, suggesting right atrial pressure of 3 mmHg.    11/25/2021: stress myoview ([facility]) 1. Good exercise capacity with marked baseline ECG abnormalities  2. No evidence of ischemia or scar on myocardial perfusion imaging  3. Normal LV systolic function   Recent Labs: 09/18/2022: BUN 14; Creatinine, Ser 0.82; Hemoglobin 14.7; Platelets 179; Potassium 4.4; Sodium 138  No results found for requested labs within last 365 days.   CrCl cannot be calculated (Patient's most recent lab result is older than the maximum 21 days allowed.).   Wt Readings from Last 3 Encounters:  09/22/22 165 lb (74.8 kg)  07/09/22 166 lb 12.8 oz (75.7 kg)  12/25/21 163 lb (73.9 kg)     Other studies reviewed: Additional studies/records reviewed today include: summarized above  ASSESSMENT AND PLAN:  PPM Intact function  Paroxysmal AFib CHA2DS2Vasc is 3, on Eliquis, appropriately dosed zero % burden  HTN Looks good  Bicuspid AV No AS on his echo Dilated Ao root Mild dilation of ascending AO on his echo 5mm Iliac aneurysms  Surveillance and management as per gen cardiology team   7.  Secondary hypercoagulable state    Disposition: F/u with Dr. Rockey Situ as a new patient when able, otherwise as scheduled for Ep, sooner if needed.    Current medicines are reviewed at length with the patient today.  The patient did not have any concerns regarding medicines.  Venetia Night, PA-C 10/10/2022 7:15 AM     [facility] [address] [place] [address] [phone] (office)  [phone] (fax)

## 2022-11-05 ENCOUNTER — Ambulatory Visit: Payer: Medicare Other | Admitting: Dermatology

## 2022-11-05 VITALS — BP 136/80 | HR 64

## 2022-11-05 DIAGNOSIS — L821 Other seborrheic keratosis: Secondary | ICD-10-CM | POA: Diagnosis not present

## 2022-11-05 DIAGNOSIS — L814 Other melanin hyperpigmentation: Secondary | ICD-10-CM

## 2022-11-05 DIAGNOSIS — Z85828 Personal history of other malignant neoplasm of skin: Secondary | ICD-10-CM | POA: Diagnosis not present

## 2022-11-05 DIAGNOSIS — L570 Actinic keratosis: Secondary | ICD-10-CM

## 2022-11-05 DIAGNOSIS — L578 Other skin changes due to chronic exposure to nonionizing radiation: Secondary | ICD-10-CM

## 2022-11-05 DIAGNOSIS — L219 Seborrheic dermatitis, unspecified: Secondary | ICD-10-CM

## 2022-11-05 NOTE — Progress Notes (Unsigned)
Follow-Up Visit   Subjective  Todd Lucas is a 82 y.o. male who presents for the following: Recheck bx proven BCC of the R post neck, previously treated with ED&C at last visit. Tx bx proven AK mid ant vertex scalp, and recheck previously treated Aks. Irregular skin lesion on the L cheek x 3 weeks, pt originally thought lesion was a pimple, but it still hasn't resolved.    The following portions of the chart were reviewed this encounter and updated as appropriate: medications, allergies, medical history  Review of Systems:  No other skin or systemic complaints except as noted in HPI or Assessment and Plan.  Objective  Well appearing patient in no apparent distress; mood and affect are within normal limits.   A focused examination was performed of the following areas: the face, scalp, ears, and neck   Relevant exam findings are noted in the Assessment and Plan.  Mid ant vertex scalp x 1 Erythematous thin papules/macules with gritty scale.   L cheek x 1, L vertex scalp x 1, L temple x 1 (3) Erythematous thin papules/macules with gritty scale.   R cheek x 3, R nose x 1 (4) Erythematous thin papules/macules with gritty scale.     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 1  Bx proven - treated today with LN2   Destruction of lesion - Mid ant vertex scalp x 1 Complexity: simple   Destruction method: cryotherapy   Informed consent: discussed and consent obtained   Timeout:  patient name, date of birth, surgical site, and procedure verified Lesion destroyed using liquid nitrogen: Yes   Region frozen until ice ball extended beyond lesion: Yes   Outcome: patient tolerated procedure well with no complications   Post-procedure details: wound care instructions given    Keratosis, actinic (3) L cheek x 1, L vertex scalp x 1, L temple x 1    Destruction of lesion - L cheek x 1, L vertex scalp x 1, L temple x 1 Complexity: simple    Destruction method: cryotherapy   Informed consent: discussed and consent obtained   Timeout:  patient name, date of birth, surgical site, and procedure verified Lesion destroyed using liquid nitrogen: Yes   Region frozen until ice ball extended beyond lesion: Yes   Outcome: patient tolerated procedure well with no complications   Post-procedure details: wound care instructions given    Hypertrophic actinic keratosis (4) R cheek x 3, R nose x 1  Hypertrophic, debrided today. Contact clinic if not clear in 6-8 wks.   Destruction of lesion - R cheek x 3, R nose x 1 Complexity: simple   Destruction method: cryotherapy   Informed consent: discussed and consent obtained   Timeout:  patient name, date of birth, surgical site, and procedure verified Lesion destroyed using liquid nitrogen: Yes   Region frozen until ice ball extended beyond lesion: Yes   Outcome: patient tolerated procedure well with no complications   Post-procedure details: wound care instructions given     ACTINIC DAMAGE WITH PRECANCEROUS ACTINIC KERATOSES Counseling for Topical Chemotherapy Management: Patient exhibits: - Severe, confluent actinic changes with pre-cancerous actinic keratoses that is secondary to cumulative UV radiation exposure over time - Condition that is severe; chronic, not at goal. - diffuse scaly erythematous macules and papules with underlying dyspigmentation - Discussed Prescription "Field Treatment" topical Chemotherapy for Severe, Chronic Confluent Actinic Changes with Pre-Cancerous Actinic Keratoses Field treatment involves treatment of an  entire area of skin that has confluent Actinic Changes (Sun/ Ultraviolet light damage) and PreCancerous Actinic Keratoses by method of PhotoDynamic Therapy (PDT) and/or prescription Topical Chemotherapy agents such as 5-fluorouracil, 5-fluorouracil/calcipotriene, and/or imiquimod.  The purpose is to decrease the number of clinically evident and subclinical  PreCancerous lesions to prevent progression to development of skin cancer by chemically destroying early precancer changes that may or may not be visible.  It has been shown to reduce the risk of developing skin cancer in the treated area. As a result of treatment, redness, scaling, crusting, and open sores may occur during treatment course. One or more than one of these methods may be used and may have to be used several times to control, suppress and eliminate the PreCancerous changes. Discussed treatment course, expected reaction, and possible side effects. - Recommend daily broad spectrum sunscreen SPF 30+ to sun-exposed areas, reapply every 2 hours as needed.  - Staying in the shade or wearing long sleeves, sun glasses (UVA+UVB protection) and wide brim hats (4-inch brim around the entire circumference of the hat) are also recommended. - Call for new or changing lesions.  HISTORY OF BASAL CELL CARCINOMA OF THE SKIN - No evidence of recurrence today - Recommend regular full body skin exams - Recommend daily broad spectrum sunscreen SPF 30+ to sun-exposed areas, reapply every 2 hours as needed.  - Call if any new or changing lesions are noted between office visits  SEBORRHEIC KERATOSIS - Stuck-on, waxy, tan-brown papules and/or plaques  - Benign-appearing - Discussed benign etiology and prognosis. - Observe - Call for any changes  LENTIGINES Exam: scattered tan macules Due to sun exposure Treatment Plan: Benign-appearing, observe. Recommend daily broad spectrum sunscreen SPF 30+ to sun-exposed areas, reapply every 2 hours as needed.  Call for any changes  SEBORRHEIC DERMATITIS - petaloid type Exam: Well demarcated thin pink soft plaque with minimal scale. without features suspicious for malignancy on dermoscopy  Chronic and persistent condition with duration or expected duration over one year. Condition is bothersome/symptomatic for patient. Currently flared.  Seborrheic Dermatitis is  a chronic persistent rash characterized by pinkness and scaling most commonly of the mid face but also can occur on the scalp (dandruff), ears; mid chest, mid back and groin.  It tends to be exacerbated by stress and cooler weather.  People who have neurologic disease may experience new onset or exacerbation of existing seborrheic dermatitis.  The condition is not curable but treatable and can be controlled.  Treatment Plan: Sample given of [place] apply to aa QD PRN.    Return for AK follow up in 6-12 wks.  Maylene Roes, CMA, am acting as scribe for Darden Dates, MD .   Documentation: I have reviewed the above documentation for accuracy and completeness, and I agree with the above.  Darden Dates, MD

## 2022-11-05 NOTE — Patient Instructions (Signed)
Due to recent changes in healthcare laws, you may see results of your pathology and/or laboratory studies on MyChart before the doctors have had a chance to review them. We understand that in some cases there may be results that are confusing or concerning to you. Please understand that not all results are received at the same time and often the doctors may need to interpret multiple results in order to provide you with the best plan of care or course of treatment. Therefore, we ask that you please give us 2 business days to thoroughly review all your results before contacting the office for clarification. Should we see a critical lab result, you will be contacted sooner.   If You Need Anything After Your Visit  If you have any questions or concerns for your doctor, please call our main line at [phone] and press option 4 to reach your doctor's medical assistant. If no one answers, please leave a voicemail as directed and we will return your call as soon as possible. Messages left after 4 pm will be answered the following business day.   You may also send us a message via MyChart. We typically respond to MyChart messages within 1-2 business days.  For prescription refills, please ask your pharmacy to contact our office. Our fax number is [phone].  If you have an urgent issue when the clinic is closed that cannot wait until the next business day, you can page your doctor at the number below.    Please note that while we do our best to be available for urgent issues outside of office hours, we are not available 24/7.   If you have an urgent issue and are unable to reach us, you may choose to seek medical care at your doctor's office, retail clinic, urgent care center, or emergency room.  If you have a medical emergency, please immediately call 911 or go to the emergency department.  Pager Numbers  - Dr. Kowalski: [phone]  - Dr. Moye: [phone]  - Dr. Stewart:  [phone]  In the event of inclement weather, please call our main line at [phone] for an update on the status of any delays or closures.  Dermatology Medication Tips: Please keep the boxes that topical medications come in in order to help keep track of the instructions about where and how to use these. Pharmacies typically print the medication instructions only on the boxes and not directly on the medication tubes.   If your medication is too expensive, please contact our office at [phone] option 4 or send us a message through MyChart.   We are unable to tell what your co-pay for medications will be in advance as this is different depending on your insurance coverage. However, we may be able to find a substitute medication at lower cost or fill out paperwork to get insurance to cover a needed medication.   If a prior authorization is required to get your medication covered by your insurance company, please allow us 1-2 business days to complete this process.  Drug prices often vary depending on where the prescription is filled and some pharmacies may offer cheaper prices.  The website www.goodrx.com contains coupons for medications through different pharmacies. The prices here do not account for what the cost may be with help from insurance (it may be cheaper with your insurance), but the website can give you the price if you did not use any insurance.  - You can print the associated coupon and take it with   your prescription to the pharmacy.  - You may also stop by our office during regular business hours and pick up a GoodRx coupon card.  - If you need your prescription sent electronically to a different pharmacy, notify our office through [facility] MyChart or by phone at [phone] option 4.     Si Usted Necesita Algo Despus de Su Visita  Tambin puede enviarnos un mensaje a travs de MyChart. Por lo general respondemos a los mensajes de MyChart en el transcurso de 1 a 2  das hbiles.  Para renovar recetas, por favor pida a su farmacia que se ponga en contacto con nuestra oficina. Nuestro nmero de fax es el [phone].  Si tiene un asunto urgente cuando la clnica est cerrada y que no puede esperar hasta el siguiente da hbil, puede llamar/localizar a su doctor(a) al nmero que aparece a continuacin.   Por favor, tenga en cuenta que aunque hacemos todo lo posible para estar disponibles para asuntos urgentes fuera del horario de oficina, no estamos disponibles las 24 horas del da, los 7 das de la semana.   Si tiene un problema urgente y no puede comunicarse con nosotros, puede optar por buscar atencin mdica  en el consultorio de su doctor(a), en una clnica privada, en un centro de atencin urgente o en una sala de emergencias.  Si tiene una emergencia mdica, por favor llame inmediatamente al 911 o vaya a la sala de emergencias.  Nmeros de bper  - Dr. Kowalski: [phone]  - Dra. Moye: [phone]  - Dra. Stewart: [phone]  En caso de inclemencias del tiempo, por favor llame a nuestra lnea principal al [phone] para una actualizacin sobre el estado de cualquier retraso o cierre.  Consejos para la medicacin en dermatologa: Por favor, guarde las cajas en las que vienen los medicamentos de uso tpico para ayudarle a seguir las instrucciones sobre dnde y cmo usarlos. Las farmacias generalmente imprimen las instrucciones del medicamento slo en las cajas y no directamente en los tubos del medicamento.   Si su medicamento es muy caro, por favor, pngase en contacto con nuestra oficina llamando al [phone] y presione la opcin 4 o envenos un mensaje a travs de MyChart.   No podemos decirle cul ser su copago por los medicamentos por adelantado ya que esto es diferente dependiendo de la cobertura de su seguro. Sin embargo, es posible que podamos encontrar un medicamento sustituto a menor costo o llenar un formulario para que el  seguro cubra el medicamento que se considera necesario.   Si se requiere una autorizacin previa para que su compaa de seguros cubra su medicamento, por favor permtanos de 1 a 2 das hbiles para completar este proceso.  Los precios de los medicamentos varan con frecuencia dependiendo del lugar de dnde se surte la receta y alguna farmacias pueden ofrecer precios ms baratos.  El sitio web www.goodrx.com tiene cupones para medicamentos de diferentes farmacias. Los precios aqu no tienen en cuenta lo que podra costar con la ayuda del seguro (puede ser ms barato con su seguro), pero el sitio web puede darle el precio si no utiliz ningn seguro.  - Puede imprimir el cupn correspondiente y llevarlo con su receta a la farmacia.  - Tambin puede pasar por nuestra oficina durante el horario de atencin regular y recoger una tarjeta de cupones de GoodRx.  - Si necesita que su receta se enve electrnicamente a una farmacia diferente, informe a nuestra oficina a travs de MyChart de [facility]   o por telfono llamando al [phone] y presione la opcin 4.  

## 2022-11-19 ENCOUNTER — Telehealth: Payer: Self-pay

## 2022-11-19 NOTE — Telephone Encounter (Signed)
Patient called regarding AK frozen on 11/05/2022. The area is not healing and continuously bleeding. Patient states he also has a new nodule on his neck.   Found 20 minutes cancellations 11/27/22.

## 2022-11-27 ENCOUNTER — Ambulatory Visit (INDEPENDENT_AMBULATORY_CARE_PROVIDER_SITE_OTHER): Payer: Medicare Other | Admitting: Dermatology

## 2022-11-27 VITALS — BP 122/73 | HR 63

## 2022-11-27 DIAGNOSIS — D489 Neoplasm of uncertain behavior, unspecified: Secondary | ICD-10-CM

## 2022-11-27 DIAGNOSIS — L570 Actinic keratosis: Secondary | ICD-10-CM | POA: Diagnosis not present

## 2022-11-27 DIAGNOSIS — C4492 Squamous cell carcinoma of skin, unspecified: Secondary | ICD-10-CM

## 2022-11-27 DIAGNOSIS — L578 Other skin changes due to chronic exposure to nonionizing radiation: Secondary | ICD-10-CM

## 2022-11-27 DIAGNOSIS — C44329 Squamous cell carcinoma of skin of other parts of face: Secondary | ICD-10-CM

## 2022-11-27 DIAGNOSIS — W908XXA Exposure to other nonionizing radiation, initial encounter: Secondary | ICD-10-CM | POA: Diagnosis not present

## 2022-11-27 DIAGNOSIS — Z872 Personal history of diseases of the skin and subcutaneous tissue: Secondary | ICD-10-CM

## 2022-11-27 DIAGNOSIS — X32XXXA Exposure to sunlight, initial encounter: Secondary | ICD-10-CM

## 2022-11-27 HISTORY — DX: Squamous cell carcinoma of skin, unspecified: C44.92

## 2022-11-27 NOTE — Progress Notes (Signed)
Follow-Up Visit   Subjective  Todd Lucas is a 82 y.o. male who presents for the following: hx of aks, reports noticing a spot at left lower cheek that doesn't go away, patient reports is painful when presses on.  The following portions of the chart were reviewed this encounter and updated as appropriate: medications, allergies, medical history  Review of Systems:  No other skin or systemic complaints except as noted in HPI or Assessment and Plan.  Objective  Well appearing patient in no apparent distress; mood and affect are within normal limits.   A focused examination was performed of the following areas: Left cheek   Relevant exam findings are noted in the Assessment and Plan.  left cheek x 1 Erythematous thin papules/macules with gritty scale.   left cheek 0.6 cm firm erythema excoriatied papule           Assessment & Plan     Actinic keratosis left cheek x 1  Actinic keratoses are precancerous spots that appear secondary to cumulative UV radiation exposure/sun exposure over time. They are chronic with expected duration over 1 year. A portion of actinic keratoses will progress to squamous cell carcinoma of the skin. It is not possible to reliably predict which spots will progress to skin cancer and so treatment is recommended to prevent development of skin cancer.  Recommend daily broad spectrum sunscreen SPF 30+ to sun-exposed areas, reapply every 2 hours as needed.  Recommend staying in the shade or wearing long sleeves, sun glasses (UVA+UVB protection) and wide brim hats (4-inch brim around the entire circumference of the hat). Call for new or changing lesions.  Destruction of lesion - left cheek x 1  Destruction method: cryotherapy   Informed consent: discussed and consent obtained   Lesion destroyed using liquid nitrogen: Yes   Cryotherapy cycles:  2 Outcome: patient tolerated procedure well with no complications   Post-procedure details: wound  care instructions given   Additional details:  Prior to procedure, discussed risks of blister formation, small wound, skin dyspigmentation, or rare scar following cryotherapy. Recommend Vaseline ointment to treated areas while healing.   Neoplasm of uncertain behavior left cheek  Skin / nail biopsy Type of biopsy: punch   Informed consent: discussed and consent obtained   Timeout: patient name, date of birth, surgical site, and procedure verified   Patient was prepped and draped in usual sterile fashion: Area prepped with isopropyl alcohol. Anesthesia: the lesion was anesthetized in a standard fashion   Anesthetic:  1% lidocaine w/ epinephrine 1-100,000 buffered w/ 8.4% NaHCO3 Punch size:  2 mm Suture size:  4-0 Suture type: Vicryl Rapide (coated polyglactin 910)   Hemostasis achieved with: suture and aluminum chloride   Outcome: patient tolerated procedure well   Post-procedure details: wound care instructions given   Additional details:  Mupirocin and a dressing applied  Specimen 1 - Surgical pathology Differential Diagnosis: R/o cyst vs scc vs other   Check Margins: No   R/o cyst vs scc vs other    Recommend mohs if scc If cyst can   ACTINIC DAMAGE - chronic, secondary to cumulative UV radiation exposure/sun exposure over time - diffuse scaly erythematous macules with underlying dyspigmentation - Recommend daily broad spectrum sunscreen SPF 30+ to sun-exposed areas, reapply every 2 hours as needed.  - Recommend staying in the shade or wearing long sleeves, sun glasses (UVA+UVB protection) and wide brim hats (4-inch brim around the entire circumference of the hat). - Call for new or changing  lesions.   Return for keep follow up as scheduled .  I, Asher Muir, CMA, am acting as scribe for Darden Dates, MD.   Documentation: I have reviewed the above documentation for accuracy and completeness, and I agree with the above.  Darden Dates, MD

## 2022-11-27 NOTE — Patient Instructions (Addendum)
   Biopsy Wound Care Instructions  Leave the original bandage on for 24 hours if possible.  If the bandage becomes soaked or soiled before that time, it is OK to remove it and examine the wound.  A small amount of post-operative bleeding is normal.  If excessive bleeding occurs, remove the bandage, place gauze over the site and apply continuous pressure (no peeking) over the area for 30 minutes. If this does not work, please call our clinic as soon as possible or page your doctor if it is after hours.   Once a day, cleanse the wound with soap and water. It is fine to shower. If a thick crust develops you may use a Q-tip dipped into dilute hydrogen peroxide (mix 1:1 with water) to dissolve it.  Hydrogen peroxide can slow the healing process, so use it only as needed.    After washing, apply petroleum jelly (Vaseline) or an antibiotic ointment if your doctor prescribed one for you, followed by a bandage.    For best healing, the wound should be covered with a layer of ointment at all times. If you are not able to keep the area covered with a bandage to hold the ointment in place, this may mean re-applying the ointment several times a day.  Continue this wound care until the wound has healed and is no longer open.   Itching and mild discomfort is normal during the healing process. However, if you develop pain or severe itching, please call our office.   If you have any discomfort, you can take Tylenol (acetaminophen) or ibuprofen as directed on the bottle. (Please do not take these if you have an allergy to them or cannot take them for another reason).  Some redness, tenderness and white or yellow material in the wound is normal healing.  If the area becomes very sore and red, or develops a thick yellow-green material (pus), it may be infected; please notify us.    If you have stitches, return to clinic as directed to have the stitches removed. You will continue wound care for 2-3 days after the  stitches are removed.   Wound healing continues for up to one year following surgery. It is not unusual to experience pain in the scar from time to time during the interval.  If the pain becomes severe or the scar thickens, you should notify the office.    A slight amount of redness in a scar is expected for the first six months.  After six months, the redness will fade and the scar will soften and fade.  The color difference becomes less noticeable with time.  If there are any problems, return for a post-op surgery check at your earliest convenience.  To improve the appearance of the scar, you can use silicone scar gel, cream, or sheets (such as Mederma or Serica) every night for up to one year. These are available over the counter (without a prescription).  Please call our office at [phone] for any questions or concerns.     Due to recent changes in healthcare laws, you may see results of your pathology and/or laboratory studies on MyChart before the doctors have had a chance to review them. We understand that in some cases there may be results that are confusing or concerning to you. Please understand that not all results are received at the same time and often the doctors may need to interpret multiple results in order to provide you with the best plan of   care or course of treatment. Therefore, we ask that you please give us 2 business days to thoroughly review all your results before contacting the office for clarification. Should we see a critical lab result, you will be contacted sooner.   If You Need Anything After Your Visit  If you have any questions or concerns for your doctor, please call our main line at [phone] and press option 4 to reach your doctor's medical assistant. If no one answers, please leave a voicemail as directed and we will return your call as soon as possible. Messages left after 4 pm will be answered the following business day.   You may also send us a  message via MyChart. We typically respond to MyChart messages within 1-2 business days.  For prescription refills, please ask your pharmacy to contact our office. Our fax number is [phone].  If you have an urgent issue when the clinic is closed that cannot wait until the next business day, you can page your doctor at the number below.    Please note that while we do our best to be available for urgent issues outside of office hours, we are not available 24/7.   If you have an urgent issue and are unable to reach us, you may choose to seek medical care at your doctor's office, retail clinic, urgent care center, or emergency room.  If you have a medical emergency, please immediately call 911 or go to the emergency department.  Pager Numbers  - Dr. Kowalski: [phone]  - Dr. Moye: [phone]  - Dr. Stewart: [phone]  In the event of inclement weather, please call our main line at [phone] for an update on the status of any delays or closures.  Dermatology Medication Tips: Please keep the boxes that topical medications come in in order to help keep track of the instructions about where and how to use these. Pharmacies typically print the medication instructions only on the boxes and not directly on the medication tubes.   If your medication is too expensive, please contact our office at [phone] option 4 or send us a message through MyChart.   We are unable to tell what your co-pay for medications will be in advance as this is different depending on your insurance coverage. However, we may be able to find a substitute medication at lower cost or fill out paperwork to get insurance to cover a needed medication.   If a prior authorization is required to get your medication covered by your insurance company, please allow us 1-2 business days to complete this process.  Drug prices often vary depending on where the prescription is filled and some pharmacies may offer  cheaper prices.  The website www.goodrx.com contains coupons for medications through different pharmacies. The prices here do not account for what the cost may be with help from insurance (it may be cheaper with your insurance), but the website can give you the price if you did not use any insurance.  - You can print the associated coupon and take it with your prescription to the pharmacy.  - You may also stop by our office during regular business hours and pick up a GoodRx coupon card.  - If you need your prescription sent electronically to a different pharmacy, notify our office through [facility] MyChart or by phone at [phone] option 4.     Si Usted Necesita Algo Despus de Su Visita  Tambin puede enviarnos un mensaje a travs de MyChart. Por lo general   respondemos a los mensajes de MyChart en el transcurso de 1 a 2 das hbiles.  Para renovar recetas, por favor pida a su farmacia que se ponga en contacto con nuestra oficina. Nuestro nmero de fax es el [phone].  Si tiene un asunto urgente cuando la clnica est cerrada y que no puede esperar hasta el siguiente da hbil, puede llamar/localizar a su doctor(a) al nmero que aparece a continuacin.   Por favor, tenga en cuenta que aunque hacemos todo lo posible para estar disponibles para asuntos urgentes fuera del horario de oficina, no estamos disponibles las 24 horas del da, los 7 das de la semana.   Si tiene un problema urgente y no puede comunicarse con nosotros, puede optar por buscar atencin mdica  en el consultorio de su doctor(a), en una clnica privada, en un centro de atencin urgente o en una sala de emergencias.  Si tiene una emergencia mdica, por favor llame inmediatamente al 911 o vaya a la sala de emergencias.  Nmeros de bper  - Dr. Kowalski: [phone]  - Dra. Moye: [phone]  - Dra. Stewart: [phone]  En caso de inclemencias del tiempo, por favor llame a nuestra lnea principal al  [phone] para una actualizacin sobre el estado de cualquier retraso o cierre.  Consejos para la medicacin en dermatologa: Por favor, guarde las cajas en las que vienen los medicamentos de uso tpico para ayudarle a seguir las instrucciones sobre dnde y cmo usarlos. Las farmacias generalmente imprimen las instrucciones del medicamento slo en las cajas y no directamente en los tubos del medicamento.   Si su medicamento es muy caro, por favor, pngase en contacto con nuestra oficina llamando al [phone] y presione la opcin 4 o envenos un mensaje a travs de MyChart.   No podemos decirle cul ser su copago por los medicamentos por adelantado ya que esto es diferente dependiendo de la cobertura de su seguro. Sin embargo, es posible que podamos encontrar un medicamento sustituto a menor costo o llenar un formulario para que el seguro cubra el medicamento que se considera necesario.   Si se requiere una autorizacin previa para que su compaa de seguros cubra su medicamento, por favor permtanos de 1 a 2 das hbiles para completar este proceso.  Los precios de los medicamentos varan con frecuencia dependiendo del lugar de dnde se surte la receta y alguna farmacias pueden ofrecer precios ms baratos.  El sitio web www.goodrx.com tiene cupones para medicamentos de diferentes farmacias. Los precios aqu no tienen en cuenta lo que podra costar con la ayuda del seguro (puede ser ms barato con su seguro), pero el sitio web puede darle el precio si no utiliz ningn seguro.  - Puede imprimir el cupn correspondiente y llevarlo con su receta a la farmacia.  - Tambin puede pasar por nuestra oficina durante el horario de atencin regular y recoger una tarjeta de cupones de GoodRx.  - Si necesita que su receta se enve electrnicamente a una farmacia diferente, informe a nuestra oficina a travs de MyChart de [facility] o por telfono llamando al [phone] y presione la opcin 4.  

## 2022-12-04 ENCOUNTER — Telehealth: Payer: Self-pay

## 2022-12-04 DIAGNOSIS — C4492 Squamous cell carcinoma of skin, unspecified: Secondary | ICD-10-CM

## 2022-12-04 NOTE — Telephone Encounter (Signed)
Advised pt of bx results.  Discussed mohs, pt would like referral to go to [facility].  Referral emailed to [facility]./sh

## 2022-12-04 NOTE — Telephone Encounter (Signed)
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 SCC on the face can be more aggressive than other areas, so I recommend Mohs as the best option.   If he declines Mohs, we could do an ED&C or chemotherapy injections.    MAs please call with results and refer. Please let me know if they have any questions. Thank you!

## 2022-12-24 LAB — CUP PACEART REMOTE DEVICE CHECK
Battery Remaining Longevity: 161 mo
Battery Voltage: 3.19 V
Brady Statistic AP VP Percent: 39.05 %
Brady Statistic AP VS Percent: 0 %
Brady Statistic AS VP Percent: 60.94 %
Brady Statistic AS VS Percent: 0.01 %
Brady Statistic RA Percent Paced: 38.87 %
Brady Statistic RV Percent Paced: 99.99 %
Date Time Interrogation Session: 20240612201755
Implantable Pulse Generator Implant Date: 20240311
Lead Channel Impedance Value: 304 Ohm
Lead Channel Impedance Value: 380 Ohm
Lead Channel Impedance Value: 456 Ohm
Lead Channel Impedance Value: 608 Ohm
Lead Channel Pacing Threshold Amplitude: 0.5 V
Lead Channel Pacing Threshold Amplitude: 0.75 V
Lead Channel Pacing Threshold Pulse Width: 0.4 ms
Lead Channel Pacing Threshold Pulse Width: 0.4 ms
Lead Channel Sensing Intrinsic Amplitude: 1.75 mV
Lead Channel Sensing Intrinsic Amplitude: 1.75 mV
Lead Channel Sensing Intrinsic Amplitude: 31.625 mV
Lead Channel Setting Pacing Amplitude: 1 V
Lead Channel Setting Pacing Amplitude: 1.5 V
Lead Channel Setting Pacing Pulse Width: 0.4 ms
Lead Channel Setting Sensing Sensitivity: 1.2 mV
Zone Setting Status: 755011
Zone Setting Status: 755011

## 2022-12-25 ENCOUNTER — Ambulatory Visit (INDEPENDENT_AMBULATORY_CARE_PROVIDER_SITE_OTHER): Payer: Medicare Other

## 2022-12-25 DIAGNOSIS — I480 Paroxysmal atrial fibrillation: Secondary | ICD-10-CM | POA: Diagnosis not present

## 2022-12-26 ENCOUNTER — Telehealth: Payer: Self-pay | Admitting: Dermatology

## 2022-12-26 NOTE — Telephone Encounter (Addendum)
Please send a certified letter. Thank Todd Lucas!    Dear Todd Lucas,  I hope this letter finds Todd Lucas well.   It has been my sincere pleasure to be a part of Todd Lucas healthcare team. At the time of my leaving, our records show Todd Lucas have a skin cancer at Todd Lucas left cheek which has not been treated yet.   Since I will be unable to follow-up with Todd Lucas in future, I am writing to stress the importance of keeping Todd Lucas Mohs surgery appointment. When treated, skin cancer typically has very good outcomes. However, if skin cancer is left untreated it can pose serious health risks including destroying nearby tissue or even death.    If for any reason Todd Lucas are unable to go to Todd Lucas appointment, please reach out to the [facility] at [phone].   Todd Lucas can also reach out to our office at [phone] option 4 or through MyChart if Todd Lucas need assistance.   If Todd Lucas have any concerns about Todd Lucas treatment and wish to discuss other treatment options, please reach out to our office as well. Todd Lucas can reach [place] by phone at [phone] option 4 or through [employer].    As I am transitioning from the practice, I regret that I will not be able to continue with Todd Lucas care personally. However, I am confident Todd Lucas are in capable hands with my colleagues and the team at [facility].    Thank Todd Lucas for entrusting me with Todd Lucas skin health. 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 Please do not hesitate to reach out if Todd Lucas have any questions or concerns.   Wishing Todd Lucas continued health and well-being.   Sincerely,   Sandi Mealy, MD, MPH Dermatologist [facility] [phone] option 4

## 2022-12-29 NOTE — Progress Notes (Unsigned)
Cardiology Office Note  Date:  12/30/2022   ID:  Todd Lucas, DOB 1940/11/11, MRN [id number]  PCP:  Todd Mina, MD   Chief Complaint  Patient presents with   Establish general cardiology     Family history of CAD/MI with his brother at age 80 having CABG x 3 with passing at age 41 with a brain aneurysm & father at 33 with an MI. Patient has a history of hyperlipidemia. Medications reviewed by the patient verbally.     HPI:  Todd Lucas is a 82 y.o. 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  Generator replacement September 22, 2022 Who presents for follow-up of his atrial fibrillation, aortic valve disease   He is with his wife today in clinic.  Per generator download, low burden of atrial fibrillation when last seen by Dr. Lalla Lucas Prior discussions concerning redo catheter ablation for higher A-fib burden if needed in the future  Echo August 2023 EF 60 to 65%, normal RV function Ascending aorta 4.2 cm Bicuspid aortic valve  Lab work reviewed  total cholesterol 181 LDL 97  Denies significant chest pain or shortness of breath concerning for angina Does  regular exercise  EKG personally reviewed by myself on todays visit Normal sinus rhythm, paced rate 59 bpm   PMH:   has a past medical history of Actinic keratosis (09/17/2022), Atrial fibrillation (HCC), Basal cell carcinoma (08/04/2018), Basal cell carcinoma (09/17/2022), Dysrhythmia, Heart murmur, basal cell carcinoma (05/26/2018), squamous cell carcinoma of skin (05/26/2018), Hyperlipidemia, Loose stools, and Squamous cell carcinoma of skin (11/27/2022).  PSH:    Past Surgical History:  Procedure Laterality Date   aneurysm artery iliac common     COLONOSCOPY WITH PROPOFOL N/A 09/30/2021   Procedure: COLONOSCOPY  WITH PROPOFOL;  Surgeon: [name], MD;  Location: [facility] ENDOSCOPY;  Service: Endoscopy;  Laterality: N/A;   heart ablation     INSERT / REPLACE / REMOVE PACEMAKER     PPM GENERATOR CHANGEOUT N/A 09/22/2022   Procedure: PPM GENERATOR CHANGEOUT;  Surgeon: [name], MD;  Location: [facility] INVASIVE CV LAB;  Service: Cardiovascular;  Laterality: N/A;    Current Outpatient Medications  Medication Sig Dispense Refill   acetaminophen (TYLENOL) 500 MG tablet Take 1,000 mg by mouth every 8 (eight) hours as needed for moderate pain.     apixaban (ELIQUIS) 5 MG TABS tablet Take 1 tablet (5 mg total) by mouth 2 (two) times daily. 180 tablet 1   cyanocobalamin (VITAMIN B12) 1000 MCG tablet Take 1,000 mcg by mouth daily with supper.     diltiazem (CARDIZEM) 30 MG tablet Take 30 mg by mouth 2 (two) times daily as needed (afib).     levothyroxine (SYNTHROID) 88 MCG tablet Take 88 mcg by mouth every morning.     Polyethyl Glycol-Propyl Glycol (SYSTANE ULTRA) 0.4-0.3 % SOLN Place 1 drop into both eyes daily as needed (dry eyes).     rosuvastatin (CRESTOR) 20 MG tablet Take 20 mg by mouth daily.     zolpidem (AMBIEN) 10 MG tablet Take 10 mg by mouth at bedtime as needed for sleep.     amoxicillin (AMOXIL) 500 MG tablet Take by mouth. Take 4 tablets an hour before prior to dental procedures (Patient not taking: Reported on 12/30/2022)     No current facility-administered medications for this visit.  Allergies:   Patient has no known allergies.   Social History:  The patient  reports that he quit smoking about 48 years ago. His smoking use included cigarettes. He has a 30.00 pack-year smoking history. He has never used smokeless tobacco. He reports current alcohol use of about 2.0 standard drinks of alcohol per week. He reports that he does not use drugs.   Family History:   family history includes Angina in his mother; Anuerysm in his brother; Heart attack (age of onset: 31) in his brother;  Heart attack (age of onset: 32) in his father; Heart attack (age of onset: 60) in his maternal grandfather; Heart disease in his brother and father.    Review of Systems: Review of Systems  Constitutional: Negative.   HENT: Negative.    Respiratory: Negative.    Cardiovascular: Negative.   Gastrointestinal: Negative.   Musculoskeletal: Negative.   Neurological: Negative.   Psychiatric/Behavioral: Negative.    All other systems reviewed and are negative.    PHYSICAL EXAM: VS:  BP 104/70 (BP Location: Right Arm, Patient Position: Sitting, Cuff Size: Normal)   Pulse (!) 59   Ht 6\' 2"  (1.88 m)   Wt 167 lb 4 oz (75.9 kg)   SpO2 98%   BMI 21.47 kg/m  , BMI Body mass index is 21.47 kg/m. GEN: Well nourished, well developed, in no acute distress HEENT: normal Neck: no JVD, carotid bruits, or masses Cardiac: RRR; no murmurs, rubs, or gallops,no edema  Respiratory:  clear to auscultation bilaterally, normal work of breathing GI: soft, nontender, nondistended, + BS MS: no deformity or atrophy Skin: warm and dry, no rash Neuro:  Strength and sensation are intact Psych: euthymic mood, full affect    Recent Labs: 09/18/2022: BUN 14; Creatinine, Ser 0.82; Hemoglobin 14.7; Platelets 179; Potassium 4.4; Sodium 138    Lipid Panel No results found for: "CHOL", "HDL", "LDLCALC", "TRIG"    Wt Readings from Last 3 Encounters:  12/30/22 167 lb 4 oz (75.9 kg)  10/10/22 168 lb 12.8 oz (76.6 kg)  09/22/22 165 lb (74.8 kg)       ASSESSMENT AND PLAN:  Problem List Items Addressed This Visit   None Visit Diagnoses     Cardiac pacemaker in situ    -  Primary   Paroxysmal atrial fibrillation (HCC)       Relevant Medications   rosuvastatin (CRESTOR) 20 MG tablet   Primary hypertension       Relevant Medications   rosuvastatin (CRESTOR) 20 MG tablet   Bicuspid aortic valve       Relevant Medications   rosuvastatin (CRESTOR) 20 MG tablet   Ascending aorta dilation (HCC)        Relevant Medications   rosuvastatin (CRESTOR) 20 MG tablet      Paroxysmal atrial fibrillation Prior ablation, followed by EP, maintaining normal sinus rhythm, no changes to medications  Dilated ascending aorta and root In the setting of bicuspid aortic valve Calcium score ordered to evaluate aorta  Family history coronary disease Long discussion concerning family history, several family members were smokers Recommended calcium scoring for risk stratification  Hyperlipidemia Depending on calcium score results detailed above, potentially could add Zetia to Crestor 20 to achieve goal LDL less than 70  Essential hypertension Blood pressure is well controlled on today's visit. No changes made to the medications.     Total encounter time more than 60 minutes  Greater than 50% was spent in counseling and coordination of care with the  patient    Signed, Esmond Plants, M.D., Ph.D. [place], [place]

## 2022-12-30 ENCOUNTER — Ambulatory Visit: Payer: Medicare Other | Attending: Cardiovascular Disease | Admitting: Cardiovascular Disease

## 2022-12-30 ENCOUNTER — Encounter: Payer: Self-pay | Admitting: Cardiovascular Disease

## 2022-12-30 VITALS — BP 104/70 | HR 59 | Ht 74.0 in | Wt 167.2 lb

## 2022-12-30 DIAGNOSIS — I480 Paroxysmal atrial fibrillation: Secondary | ICD-10-CM | POA: Diagnosis not present

## 2022-12-30 DIAGNOSIS — I77810 Thoracic aortic ectasia: Secondary | ICD-10-CM | POA: Insufficient documentation

## 2022-12-30 DIAGNOSIS — Q231 Congenital insufficiency of aortic valve: Secondary | ICD-10-CM | POA: Insufficient documentation

## 2022-12-30 DIAGNOSIS — E785 Hyperlipidemia, unspecified: Secondary | ICD-10-CM | POA: Diagnosis present

## 2022-12-30 DIAGNOSIS — Z950 Presence of cardiac pacemaker: Secondary | ICD-10-CM | POA: Insufficient documentation

## 2022-12-30 DIAGNOSIS — I10 Essential (primary) hypertension: Secondary | ICD-10-CM | POA: Diagnosis not present

## 2022-12-30 NOTE — Patient Instructions (Addendum)
Medication Instructions:  No changes  If you need a refill on your cardiac medications before your next appointment, please call your pharmacy.   Lab work: No new labs needed  Testing/Procedures: CT coronary calcium score for family history  CT coronary calcium score.   - $99 out of pocket cost at the time of your test - Call [phone] to schedule at your convenience.  [facility] [address] [place], [place] [address]   Coronary CalciumScan A coronary calcium scan is an imaging test used to look for deposits of calcium and other fatty materials (plaques) in the inner lining of the blood vessels of the heart (coronary arteries). These deposits of calcium and plaques can partly clog and narrow the coronary arteries without producing any symptoms or warning signs. This puts a person at risk for a heart attack. This test can detect these deposits before symptoms develop. Tell a health care provider about: Any allergies you have. All medicines you are taking, including vitamins, herbs, eye drops, creams, and over-the-counter medicines. Any problems you or family members have had with anesthetic medicines. Any blood disorders you have. Any surgeries you have had. Any medical conditions you have. Whether you are pregnant or may be pregnant. What are the risks? Generally, this is a safe procedure. However, problems may occur, including: Harm to a pregnant woman and her unborn baby. This test involves the use of radiation. Radiation exposure can be dangerous to a pregnant woman and her unborn baby. If you are pregnant, you generally should not have this procedure done. Slight increase in the risk of cancer. This is because of the radiation involved in the test. What happens before the procedure? No preparation is needed for this procedure. What happens during the procedure? You will undress and remove any jewelry around your neck or  chest. You will put on a hospital gown. Sticky electrodes will be placed on your chest. The electrodes will be connected to an electrocardiogram (ECG) machine to record a tracing of the electrical activity of your heart. A CT scanner will take pictures of your heart. During this time, you will be asked to lie still and hold your breath for 2-3 seconds while a picture of your heart is being taken. The procedure may vary among health care providers and hospitals. What happens after the procedure? You can get dressed. You can return to your normal activities. It is up to you to get the results of your test. Ask your health care provider, or the department that is doing the test, when your results will be ready. Summary A coronary calcium scan is an imaging test used to look for deposits of calcium and other fatty materials (plaques) in the inner lining of the blood vessels of the heart (coronary arteries). Generally, this is a safe procedure. Tell your health care provider if you are pregnant or may be pregnant. No preparation is needed for this procedure. A CT scanner will take pictures of your heart. You can return to your normal activities after the scan is done. This information is not intended to replace advice given to you by your health care provider. Make sure you discuss any questions you have with your health care provider. Document Released: 12/27/2007 Document Revised: 05/19/2016 Document Reviewed: 05/19/2016 Elsevier Interactive Patient Education  2017 [employer].   Follow-Up: At [facility], you and your health needs are our priority.  As part of our continuing mission to provide you with  exceptional heart care, we have created designated Provider Care Teams.  These Care Teams include your primary Cardiologist (physician) and Advanced Practice Providers (APPs -  Physician Assistants and Nurse Practitioners) who all work together to provide you with the care you need, when you  need it.  You will need a follow up appointment in 12 months  Providers on your designated Care Team:   Nicolasa Ducking, NP Eula Listen, PA-C Cadence Fransico Michael, [place]  COVID-19 Vaccine Information can be found at: [email or web address] For questions related to vaccine distribution or appointments, please email [email or web address] or call [phone].

## 2022-12-31 ENCOUNTER — Encounter: Payer: Self-pay | Admitting: Dermatology

## 2023-01-01 NOTE — Progress Notes (Deleted)
  Electrophysiology Office Follow up Visit Note:    Date:  01/01/2023   ID:  Todd Lucas, DOB 09-24-1940, MRN [id number]  PCP:  [name], MD  [facility] Cardiologist:  None  [facility] Electrophysiologist:  [name], MD    Interval History:    Todd Lucas is a 82 y.o. male who presents for a follow up visit.   He had a generator replacement September 22, 2022.  I saw him in clinic September 09, 2021 for his paroxysmal atrial fibrillation and pacemaker.  He is on Eliquis for stroke prophylaxis.  He has done well since the generator replacement.  He saw Dr. Mariah Milling December 30, 2022.      Past medical, surgical, social and family history were reviewed.  ROS:   Please see the history of present illness.    All other systems reviewed and are negative.  EKGs/Labs/Other Studies Reviewed:    The following studies were reviewed today:  January 02, 2023 in clinic device interrogation personally reviewed ***  EKG:  The ekg ordered today demonstrates ***   Physical Exam:    VS:  There were no vitals taken for this visit.    Wt Readings from Last 3 Encounters:  12/30/22 167 lb 4 oz (75.9 kg)  10/10/22 168 lb 12.8 oz (76.6 kg)  09/22/22 165 lb (74.8 kg)     GEN: *** Well nourished, well developed in no acute distress CARDIAC: ***RRR, no murmurs, rubs, gallops RESPIRATORY:  Clear to auscultation without rales, wheezing or rhonchi       ASSESSMENT:    1. Paroxysmal atrial fibrillation (HCC)   2. Cardiac pacemaker in situ   3. Primary hypertension    PLAN:    In order of problems listed above:  #Permanent pacemaker in situ Doing well after his recent generator replacement.  Device functioning appropriately.  Continue remote monitoring.  #Paroxysmal atrial fibrillation Prior ablation Continue Eliquis for stroke prophylaxis  #Hypertension *** goal today.  Recommend checking blood pressures 1-2 times per week at home and recording the values.   Recommend bringing these recordings to the primary care physician.        Signed, Steffanie Dunn, MD, [facility], [facility] 01/01/2023 9:58 PM    Electrophysiology [facility]

## 2023-01-02 ENCOUNTER — Ambulatory Visit: Payer: Medicare Other | Admitting: Cardiology

## 2023-01-02 DIAGNOSIS — I480 Paroxysmal atrial fibrillation: Secondary | ICD-10-CM

## 2023-01-02 DIAGNOSIS — Z950 Presence of cardiac pacemaker: Secondary | ICD-10-CM

## 2023-01-02 DIAGNOSIS — I10 Essential (primary) hypertension: Secondary | ICD-10-CM

## 2023-01-05 ENCOUNTER — Ambulatory Visit: Payer: Medicare Other | Admitting: Cardiology

## 2023-01-06 ENCOUNTER — Ambulatory Visit
Admission: RE | Admit: 2023-01-06 | Discharge: 2023-01-06 | Disposition: A | Discharge status: Home / Self Care | Payer: Medicare Other | Source: Ambulatory Visit | Attending: Cardiovascular Disease | Admitting: Cardiovascular Disease

## 2023-01-06 DIAGNOSIS — E785 Hyperlipidemia, unspecified: Secondary | ICD-10-CM | POA: Insufficient documentation

## 2023-01-07 ENCOUNTER — Telehealth: Payer: Self-pay | Admitting: Cardiovascular Disease

## 2023-01-07 ENCOUNTER — Telehealth: Payer: Self-pay | Admitting: Emergency Medicine

## 2023-01-07 DIAGNOSIS — R079 Chest pain, unspecified: Secondary | ICD-10-CM

## 2023-01-07 DIAGNOSIS — R931 Abnormal findings on diagnostic imaging of heart and coronary circulation: Secondary | ICD-10-CM

## 2023-01-07 DIAGNOSIS — R072 Precordial pain: Secondary | ICD-10-CM

## 2023-01-07 DIAGNOSIS — E785 Hyperlipidemia, unspecified: Secondary | ICD-10-CM

## 2023-01-07 MED ORDER — ROSUVASTATIN CALCIUM 40 MG PO TABS: 40.0000 mg | ORAL_TABLET | Freq: Every day | ORAL | 3 refills | Status: DC

## 2023-01-07 MED ORDER — EZETIMIBE 10 MG PO TABS: 10.0000 mg | ORAL_TABLET | Freq: Every day | ORAL | 3 refills | Status: DC

## 2023-01-07 NOTE — Telephone Encounter (Signed)
-----   Message from Antonieta Iba, MD sent at 01/07/2023  1:23 PM EDT ----- CT calcium score Score is severely elevated, over 5000,  all 3 vessels have significant coronary calcification Would recommend we increase Crestor up to 40 daily, add Zetia 10 mg daily Goal LDL less than 55 Would benefit from a pharmacologic lexiscan Myoview/stress test given the severity of the buildup

## 2023-01-07 NOTE — Telephone Encounter (Signed)
Called patient and notified of the following recommendations from Dr. Mariah Milling.    CT calcium score Score is severely elevated, over 5000, all 3 vessels have significant coronary calcification Would recommend we increase Crestor up to 40 daily, add Zetia 10 mg daily Goal LDL less than 55 Would benefit from a pharmacologic lexiscan Myoview/stress test given the severity of the buildup  Written by Antonieta Iba, MD on 01/07/2023  1:23 PM EDT  Patient verbalized understanding. Prescriptions sent to preferred pharmacy and orders placed for Lexiscan.

## 2023-01-07 NOTE — Telephone Encounter (Signed)
Left voice mail to schedule appt

## 2023-01-08 ENCOUNTER — Ambulatory Visit: Payer: Medicare Other | Attending: Cardiology | Admitting: Cardiology

## 2023-01-08 ENCOUNTER — Encounter: Payer: Self-pay | Admitting: Cardiology

## 2023-01-08 VITALS — BP 120/78 | HR 75 | Ht 74.0 in | Wt 164.0 lb

## 2023-01-08 DIAGNOSIS — I480 Paroxysmal atrial fibrillation: Secondary | ICD-10-CM | POA: Insufficient documentation

## 2023-01-08 DIAGNOSIS — I10 Essential (primary) hypertension: Secondary | ICD-10-CM | POA: Insufficient documentation

## 2023-01-08 DIAGNOSIS — Z950 Presence of cardiac pacemaker: Secondary | ICD-10-CM | POA: Diagnosis present

## 2023-01-08 MED ORDER — METOPROLOL SUCCINATE ER 25 MG PO TB24: 25.0000 mg | ORAL_TABLET | Freq: Every day | ORAL | 3 refills | Status: DC

## 2023-01-08 NOTE — Progress Notes (Signed)
  Electrophysiology Office Follow up Visit Note:    Date:  01/08/2023   ID:  Todd Lucas, DOB [birth year], MRN [id number]  PCP:  [name], MD  [facility] Cardiologist:  None  [facility] Electrophysiologist:  [name], MD    Interval History:    Todd Lucas is a 82 y.o. male who presents for a follow up visit.   He had a pacemaker generator replacement September 22, 2022.  He has done well after the implant.  He continues to exercise regularly.  He is with his wife today in clinic who I have previously met.       Past medical, surgical, social and family history were reviewed.  ROS:   Please see the history of present illness.    All other systems reviewed and are negative.  EKGs/Labs/Other Studies Reviewed:    The following studies were reviewed today:  January 08, 2023 in clinic device interrogation personally reviewed Battery longevity 13.3 years Lead parameter stable 2.2% atrial fibrillation burden Atrial pacing 36.6% Ventricular pacing 99.8% High ventricular rate episodes confirmed nonsustained ventricular tachycardia       Physical Exam:    VS:  BP 120/78   Pulse 75   Ht 6\' 2"  (1.88 m)   Wt 164 lb (74.4 kg)   SpO2 98%   BMI 21.06 kg/m     Wt Readings from Last 3 Encounters:  01/08/23 164 lb (74.4 kg)  12/30/22 167 lb 4 oz (75.9 kg)  10/10/22 168 lb 12.8 oz (76.6 kg)     GEN:  Well nourished, well developed in no acute distress CARDIAC: RRR, no murmurs, rubs, gallops.  Pocket healed well RESPIRATORY:  Clear to auscultation without rales, wheezing or rhonchi       ASSESSMENT:    1. Cardiac pacemaker in situ   2. Paroxysmal atrial fibrillation (HCC)   3. Primary hypertension    PLAN:    In order of problems listed above:  #Permanent pacemaker in situ Doing well after his generator replacement.  Continue remote monitoring  #Paroxysmal atrial fibrillation Low burden Continue Eliquis for stroke prophylaxis Add  metoprolol succinate 25 mg by mouth once daily  #Nonsustained ventricular tachycardia Low burden Add metoprolol as above  Follow-up 1 year with EP APP.       Signed, Steffanie Dunn, MD, [facility], [facility] 01/08/2023 8:40 PM    Electrophysiology [facility]

## 2023-01-08 NOTE — Patient Instructions (Signed)
Medication Instructions:  Your physician has recommended you make the following change in your medication:  1) START taking Toprol XL (metoprolol succinate) 25 mg daily  *If you need a refill on your cardiac medications before your next appointment, please call your pharmacy*  Follow-Up: At [facility], you and your health needs are our priority.  As part of our continuing mission to provide you with exceptional heart care, we have created designated Provider Care Teams.  These Care Teams include your primary Cardiologist (physician) and Advanced Practice Providers (APPs -  Physician Assistants and Nurse Practitioners) who all work together to provide you with the care you need, when you need it.  Your next appointment:   1 year(s)  Provider:   Sherie Don, NP

## 2023-01-09 NOTE — Telephone Encounter (Signed)
Called and spoke to patient. Patient would like to have the Myoview on 01/13/23 as planned and then discuss if further testing is needed at follow up on 01/23/23.

## 2023-01-13 ENCOUNTER — Ambulatory Visit
Admission: RE | Admit: 2023-01-13 | Discharge: 2023-01-13 | Disposition: A | Discharge status: Home / Self Care | Payer: Medicare Other | Source: Ambulatory Visit | Attending: Cardiovascular Disease | Admitting: Cardiovascular Disease

## 2023-01-13 DIAGNOSIS — R072 Precordial pain: Secondary | ICD-10-CM | POA: Diagnosis present

## 2023-01-13 DIAGNOSIS — E785 Hyperlipidemia, unspecified: Secondary | ICD-10-CM

## 2023-01-13 DIAGNOSIS — R931 Abnormal findings on diagnostic imaging of heart and coronary circulation: Secondary | ICD-10-CM | POA: Diagnosis not present

## 2023-01-13 MED ORDER — TECHNETIUM TC 99M TETROFOSMIN IV KIT
30.0000 | PACK | Freq: Once | INTRAVENOUS | Status: AC | PRN
  Administered 2023-01-13: 27.42 via INTRAVENOUS

## 2023-01-13 MED ORDER — TECHNETIUM TC 99M TETROFOSMIN IV KIT
10.0000 | PACK | Freq: Once | INTRAVENOUS | Status: AC | PRN
  Administered 2023-01-13: 9.68 via INTRAVENOUS

## 2023-01-14 LAB — NM MYOCAR MULTI W/SPECT W/WALL MOTION / EF
Angina Index: 0
Estimated workload: 7
Exercise duration (min): 6 min
Exercise duration (sec): 30 s
LV dias vol: 110 mL (ref 62–150)
LV sys vol: 32 mL
MPHR: 140 {beats}/min
Nuc Stress EF: 71 %
Peak HR: 121 {beats}/min
Percent HR: 86 %
RPE: 7
Rest HR: 53 {beats}/min
Rest Nuclear Isotope Dose: 9.7 mCi
SDS: 1
SRS: 0
SSS: 0
Stress Nuclear Isotope Dose: 27.4 mCi
TID: 0.96

## 2023-01-15 ENCOUNTER — Encounter: Payer: Self-pay | Admitting: Cardiovascular Disease

## 2023-01-19 NOTE — Progress Notes (Signed)
Remote pacemaker transmission.   

## 2023-01-23 ENCOUNTER — Ambulatory Visit: Payer: Medicare Other | Attending: Nurse Practitioner | Admitting: Nurse Practitioner

## 2023-01-23 ENCOUNTER — Encounter: Payer: Self-pay | Admitting: Nurse Practitioner

## 2023-01-23 VITALS — BP 124/78 | HR 58 | Ht 74.0 in | Wt 165.8 lb

## 2023-01-23 DIAGNOSIS — I4729 Other ventricular tachycardia: Secondary | ICD-10-CM | POA: Diagnosis not present

## 2023-01-23 DIAGNOSIS — I2510 Atherosclerotic heart disease of native coronary artery without angina pectoris: Secondary | ICD-10-CM

## 2023-01-23 DIAGNOSIS — E785 Hyperlipidemia, unspecified: Secondary | ICD-10-CM

## 2023-01-23 DIAGNOSIS — I77810 Thoracic aortic ectasia: Secondary | ICD-10-CM | POA: Diagnosis not present

## 2023-01-23 DIAGNOSIS — I951 Orthostatic hypotension: Secondary | ICD-10-CM | POA: Diagnosis present

## 2023-01-23 DIAGNOSIS — I480 Paroxysmal atrial fibrillation: Secondary | ICD-10-CM | POA: Diagnosis not present

## 2023-01-23 NOTE — Patient Instructions (Signed)
Medication Instructions:  No changes *If you need a refill on your cardiac medications before your next appointment, please call your pharmacy*   Lab Work: Your provider would like for you to return in one month to have the following labs drawn: CMET, Magnesium, Lipid.   Please go to the [facility] entrance and check in at the front desk.  You do not need an appointment.  They are open from 7am-6 pm.  You will need to be fasting.  If you have labs (blood work) drawn today and your tests are completely normal, you will receive your results only by: MyChart Message (if you have MyChart) OR A paper copy in the mail If you have any lab test that is abnormal or we need to change your treatment, we will call you to review the results.   Testing/Procedures: None ordered   Follow-Up: At [facility], you and your health needs are our priority.  As part of our continuing mission to provide you with exceptional heart care, we have created designated Provider Care Teams.  These Care Teams include your primary Cardiologist (physician) and Advanced Practice Providers (APPs -  Physician Assistants and Nurse Practitioners) who all work together to provide you with the care you need, when you need it.  We recommend signing up for the patient portal called "MyChart".  Sign up information is provided on this After Visit Summary.  MyChart is used to connect with patients for Virtual Visits (Telemedicine).  Patients are able to view lab/test results, encounter notes, upcoming appointments, etc.  Non-urgent messages can be sent to your provider as well.   To learn more about what you can do with MyChart, go to [email or web address].    Your next appointment:   3 month(s)  Provider:   [name], MD    Other Instructions A referral has placed to [facility]. They will call you to set up the appointment.

## 2023-01-23 NOTE — Progress Notes (Signed)
Office Visit    Patient Name: Alison Glas Date of Encounter: 01/23/2023  Primary Care Provider:  [name], MD Primary Cardiologist:  [name], MD  Chief Complaint    82 y.o. 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  Past Medical History    Past Medical History:  Diagnosis Date   Acquired dilation of ascending aorta and aortic root (HCC)    a. 02/2022 Echo: Asc Ao 42mm; b. 12/2022 Cardiac CT: Asc Ao 4.7cm; c. 01/2023 Myoview: 4.8cm Asc Ao noted on CT imaging.   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 Nodular. EDC   Coronary artery calcification seen on CT scan    a. 12/2022 Cardiac CT: Ca2+ = 5137 (95th%'ile). CAC > 300 in LAD/LCX/RCA; b. 01/2023 MV: small apical septal reversible defect - likely artifact due to RV pacing. EF>65%. Cor Ca2+. Asc Ao 4.8cm.   Heart block    a. prior PPM placement-->gen change 09/2022 (MDT Azure XT DR MRI SureScan, ser # [id number]).   Heart murmur    Hx of basal cell carcinoma 05/26/2018   R upper arm. Excised 07/15/2018, margins free.   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 s/p PVI in 2014.   Squamous cell carcinoma of skin 11/27/2022   L cheek, needs mohs   Past Surgical History:  Procedure Laterality Date   aneurysm artery iliac common     COLONOSCOPY WITH PROPOFOL N/A 09/30/2021   Procedure: COLONOSCOPY WITH PROPOFOL;  Surgeon: [name], MD;  Location: [facility]  ENDOSCOPY;  Service: Endoscopy;  Laterality: N/A;   heart ablation     INSERT / REPLACE / REMOVE PACEMAKER     PPM GENERATOR CHANGEOUT N/A 09/22/2022   Procedure: PPM GENERATOR CHANGEOUT;  Surgeon: [name], MD;  Location: [facility] INVASIVE CV LAB;  Service: Cardiovascular;  Laterality: N/A;    Allergies  No Known Allergies  History of Present Illness      82 y.o. 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  Echocardiogram in August 2023 showed an EF of 60 to 65% with grade 2 diastolic dysfunction, mild MR, and ascending aorta of 4.2 cm.  In March 2014, he underwent generator change in the setting of elective replacement indicators (Medtronic Azure XT DR MRI).  In setting of concern for ongoing risk and also prior history of dilated ascending aorta, he underwent cardiac CT in June of this year, which showed a calcium score of 5137 involving all of his coronary arteries.  His rosuvastatin dose was increased to 40 mg daily with addition of Zetia 10 mg daily as well.  Ascending aorta was measured at 4.7 cm.  In the setting of extensive calcification, stress testing was undertaken and showed small area of reversibility in the apical septal  myocardium, which was felt to be most likely secondary to artifact in the setting of RV pacing, though smaller ischemia cannot be ruled out.  Ascending aorta measured 4.8 cm on the study with recommendation for CT surgical evaluation.   Mr. Helmer is present with his wife today.  They both remain very active walking 4 to 5 miles most days of the week without symptoms or limitations.  He occasionally notes lightheadedness, particularly when changing position, since being placed on Toprol-XL therapy.  He denies any prior history of chest pain or  dyspnea and further denies palpitations, syncope, or edema.  He has not had any recent change in activity tolerance.  We discussed his test results at length today with all questions answered.  He wishes to be referred to cardiothoracic surgery for further evaluation of his ascending aorta.  Home Medications    Current Outpatient Medications  Medication Sig Dispense Refill   acetaminophen (TYLENOL) 500 MG tablet Take 1,000 mg by mouth every 8 (eight) hours as needed for moderate pain.     amoxicillin (AMOXIL) 500 MG tablet Take by mouth. Take 4 tablets an hour before prior to dental procedures     apixaban (ELIQUIS) 5 MG TABS tablet Take 1 tablet (5 mg total) by mouth 2 (two) times daily. 180 tablet 1   cyanocobalamin (VITAMIN B12) 1000 MCG tablet Take 1,000 mcg by mouth daily with supper.     diltiazem (CARDIZEM) 30 MG tablet Take 30 mg by mouth 2 (two) times daily as needed (afib).     ezetimibe (ZETIA) 10 MG tablet Take 1 tablet (10 mg total) by mouth daily. 90 tablet 3   levothyroxine (SYNTHROID) 88 MCG tablet Take 88 mcg by mouth every morning.     metoprolol succinate (TOPROL XL) 25 MG 24 hr tablet Take 1 tablet (25 mg total) by mouth daily. 90 tablet 3   Polyethyl Glycol-Propyl Glycol (SYSTANE ULTRA) 0.4-0.3 % SOLN Place 1 drop into both eyes daily as needed (dry eyes).     rosuvastatin (CRESTOR) 40 MG tablet Take 1 tablet (40 mg total) by mouth daily. 90 tablet 3   zolpidem (AMBIEN) 10 MG tablet Take 10 mg by mouth at bedtime as needed for sleep.     No current facility-administered medications for this visit.     Review of Systems    Occasional orthostatic lightheadedness since being placed on metoprolol succinate.  He denies chest pain, palpitations, dyspnea, pnd, orthopnea, n, v, syncope, edema, weight gain, or early satiety.  All other systems reviewed and are otherwise negative except as noted above.    Physical Exam    VS:  BP 124/78 (BP Location: Left Arm, Patient Position:  Sitting)   Pulse (!) 58   Ht 6\' 2"  (1.88 m)   Wt 165 lb 12.8 oz (75.2 kg)   SpO2 98%   BMI 21.29 kg/m  , BMI Body mass index is 21.29 kg/m.     GEN: Well nourished, well developed, in no acute distress. HEENT: normal. Neck: Supple, no JVD, carotid bruits, or masses. Cardiac: RRR, no murmurs, rubs, or gallops. No clubbing, cyanosis, edema.  Radials 2+/PT 2+ and equal bilaterally.  Respiratory:  Respirations regular and unlabored, clear to auscultation bilaterally. GI: Soft, nontender, nondistended, BS + x 4. MS: no deformity or atrophy. Skin: warm and dry, no rash. Neuro:  Strength and sensation are intact. Psych: Normal affect.  Accessory Clinical Findings    Lab Results  Component Value Date   WBC 3.8 (  L) 09/18/2022   HGB 14.7 09/18/2022   HCT 44.0 09/18/2022   MCV 97.3 09/18/2022   PLT 179 09/18/2022   Lab Results  Component Value Date   CREATININE 0.82 09/18/2022   BUN 14 09/18/2022   NA 138 09/18/2022   K 4.4 09/18/2022   CL 103 09/18/2022   CO2 28 09/18/2022    Assessment & Plan    1.  Dilated ascending aorta: Patient with a history of dilated ascending aorta which has been followed with imaging over the years.  Echo last year showed an ascending aorta 42 mm with recent cardiac CT showing significant growth to 47 mm.  Subsequent stress testing incidentally suggested 48 mm ascending aorta.  Long discussion about findings and management today.  He is normotensive on low-dose beta-blocker therapy.  Will refer to cardiothoracic surgery and patient specifically requests referral to [facility] at [place], as his daughter required aortic root replacement by Dr. Kizzie Bane in the past.  We agreed to defer follow-up chest CT angiography to Dr. Kizzie Bane.  2.  Coronary artery disease/elevated coronary calcium score: Calcium score 5137, placing him in the 95th percentile.  Following this finding, his rosuvastatin was increased to 40 mg daily and Zetia was added.  Stress testing showed  a small, reversible, apical septal defect likely secondary to RV pacing though ischemia cannot be excluded.  He remains very active, walking 4 to 5 miles most days of the week without symptoms or limitations.  He remains on low-dose beta-blocker therapy.  No aspirin in the setting of chronic Eliquis.  He understands that with any change in activity tolerance or development of chest discomfort, we would have very low threshold to pursue diagnostic catheterization.  3.  Orthostatic lightheadedness: He was placed on beta-blocker therapy in the setting of nonsustained VT noted on device interrogation previously.  Since then, he has occasionally noted mild orthostasis.  Blood pressures at home typically run in the 110 range though sometimes he drops into the 90s.  We discussed that it would be okay for him to reduce his Toprol-XL to 12.5 mg daily however at this time, he prefers to continue the current dose of 25 mg daily to see if he tolerates better over time, as he does not think his orthostatic symptoms occur frequently or are that bothersome.  4.  Hyperlipidemia: LDL of 97 in February 2024.  At that time, he was on Crestor 10 mg.  Dose recently increased to 40 mg and Zetia added in the setting of elevated calcium score.  I have entered orders for follow-up complete metabolic panel and lipids and he will have this done next month.  If LDL not at goal, will plan to pursue PCSK9 inhibitor if financially feasible.  5.  Nonsustained VT: Low burden of nonsustained VT noted on device interrogation previously, and he was placed on Toprol-XL at his last electrophysiology visit.  He has had mild orthostasis outlined above.  He prefers to stay on 25 mg daily but understands that if he continues to have orthostasis, but he may reduce to half a tablet daily.  6.  Paroxysmal atrial fibrillation: Status post prior PVI.  Now followed by Dr. Lalla Brothers with a low burden of PAF noted on device interrogation previously.   Beta-blocker as outlined above.  Anticoagulated with Eliquis.  7.  History of AV block: Status post Medtronic generator change in March.  Followed by EP.  8.  Disposition: Follow-up complete metabolic panel, magnesium, and lipids in 1 month.  Follow-up here in 3 months.  Referral to cardiothoracic surgery at [facility] made.  Informed Consent    Nicolasa Ducking, NP 01/23/2023, 12:58 PM

## 2023-01-23 NOTE — Telephone Encounter (Signed)
Patient seen in office today by Nicolasa Ducking, NP.

## 2023-01-28 ENCOUNTER — Telehealth: Payer: Self-pay | Admitting: Cardiovascular Disease

## 2023-01-28 NOTE — Telephone Encounter (Signed)
Called patient, advised that the referral was changed from pending review to authorized on Monday- they should be calling soon to get scheduled. Patient wife verbalized understanding.

## 2023-01-28 NOTE — Telephone Encounter (Signed)
Wife stated patient's referral to Dr. Kizzie Bane has not been received at their office and wants referral re-sent.

## 2023-02-02 NOTE — Telephone Encounter (Signed)
Referral and requested notes have been faxed to Dr. Kizzie Bane at [phone]

## 2023-02-04 ENCOUNTER — Ambulatory Visit: Payer: Medicare Other | Admitting: Dermatology

## 2023-02-23 ENCOUNTER — Other Ambulatory Visit
Admission: RE | Admit: 2023-02-23 | Discharge: 2023-02-23 | Disposition: A | Discharge status: Home / Self Care | Payer: Medicare Other | Attending: Nurse Practitioner | Admitting: Nurse Practitioner

## 2023-02-23 ENCOUNTER — Other Ambulatory Visit: Payer: Self-pay

## 2023-02-23 DIAGNOSIS — E785 Hyperlipidemia, unspecified: Secondary | ICD-10-CM | POA: Insufficient documentation

## 2023-02-23 DIAGNOSIS — I480 Paroxysmal atrial fibrillation: Secondary | ICD-10-CM

## 2023-02-23 DIAGNOSIS — I4729 Other ventricular tachycardia: Secondary | ICD-10-CM | POA: Diagnosis present

## 2023-02-23 LAB — COMPREHENSIVE METABOLIC PANEL
ALT: 88 U/L — ABNORMAL HIGH (ref 0–44)
AST: 55 U/L — ABNORMAL HIGH (ref 15–41)
Albumin: 4.5 g/dL (ref 3.5–5.0)
Alkaline Phosphatase: 59 U/L (ref 38–126)
Anion gap: 9 (ref 5–15)
BUN: 13 mg/dL (ref 8–23)
CO2: 26 mmol/L (ref 22–32)
Calcium: 9.1 mg/dL (ref 8.9–10.3)
Chloride: 105 mmol/L (ref 98–111)
Creatinine, Ser: 0.79 mg/dL (ref 0.61–1.24)
GFR, Estimated: >60 mL/min (ref >60)
Glucose, Bld: 102 mg/dL — ABNORMAL HIGH (ref 70–99)
Potassium: 4.4 mmol/L (ref 3.5–5.1)
Sodium: 140 mmol/L (ref 135–145)
Total Bilirubin: 1.2 mg/dL (ref 0.3–1.2)
Total Protein: 7.3 g/dL (ref 6.5–8.1)

## 2023-02-23 LAB — LIPID PANEL
Cholesterol: 121 mg/dL (ref 0–200)
HDL: 48 mg/dL (ref >40)
LDL Cholesterol: 56 mg/dL (ref 0–99)
Total CHOL/HDL Ratio: 2.5 RATIO
Triglycerides: 85 mg/dL (ref <150)
VLDL: 17 mg/dL (ref 0–40)

## 2023-02-23 LAB — MAGNESIUM: Magnesium: 2.4 mg/dL (ref 1.7–2.4)

## 2023-02-23 MED ORDER — APIXABAN 5 MG PO TABS
5.0000 mg | ORAL_TABLET | Freq: Two times a day (BID) | ORAL | 1 refills | Status: DC
Start: 2023-02-23 — End: 2023-08-21

## 2023-02-23 MED ORDER — ROSUVASTATIN CALCIUM 20 MG PO TABS: 20.0000 mg | ORAL_TABLET | Freq: Every day | ORAL | 3 refills | Status: DC

## 2023-02-23 NOTE — Telephone Encounter (Signed)
Prescription refill request for Eliquis received. Indication: Afib  Last office visit: 01/23/23 Brion Aliment)  Scr: 0.79 (02/23/23)  Age: 82 Weight: 75.2kg  Appropriate dose. Refill sent.

## 2023-03-06 ENCOUNTER — Encounter: Payer: Self-pay | Admitting: Cardiovascular Disease

## 2023-03-20 ENCOUNTER — Telehealth: Payer: Self-pay | Admitting: Cardiovascular Disease

## 2023-03-20 NOTE — Progress Notes (Unsigned)
Cardiology Office Note  Date:  03/23/2023   ID:  Todd Lucas, DOB 1941/02/05, MRN [id number]  PCP:  [name], MD   Chief Complaint  Patient presents with   Discuss Cardic Cath due to calcium score    "Doing well." Medications reviewed by the patient verbally.     HPI:  Todd Lucas is a 82 y.o. 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  Cath 2014: Mild nonobstructive disease Chest CTA at [facility] detailing aorta 5.1 cm at sinus of Valsalva, 4.7 cm at maximum size of ascending aorta Who presents for follow-up of his atrial fibrillation, aortic valve disease, dilated ascending aorta   He is with his wife today in clinic.  Long discussion concerning calcium score in the 5000 range He has been evaluated by vascular at [facility], no plans at this time for aorta surgery for dilated aorta measuring 5.1 cm at the sinus of Valsalva  Reports that he feels tired, concerned about underlying ischemia Nonsustained VT noted on pacer downloads  Recent Myoview reviewed with grossly homogeneous tracer uptake, apical region with perfusion defect  Cholesterol at goal on Crestor 40 with Zetia 10 daily Due to mildly elevated LFTs, Crestor cut back to 20 and Tylenol held Repeat lab work pending  EKG personally reviewed by myself on todays visit EKG Interpretation Date/Time:  Monday March 23 2023 15:35:01 EDT Ventricular Rate:  53 PR Interval:  140 QRS Duration:  194 QT Interval:  500 QTC Calculation: 469 R Axis:   -89  Text Interpretation: AV dual-paced rhythm No previous ECGs available Confirmed by [name] [phone]) on 03/23/2023 3:50:57 PM  ,  PMH:   has a past  medical history of Acquired dilation of ascending aorta and aortic root (HCC), Actinic keratosis (09/17/2022), Basal cell carcinoma (08/04/2018), Basal cell carcinoma (09/17/2022), Coronary artery calcification seen on CT scan, Heart block, Heart murmur, basal cell carcinoma (05/26/2018), 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  PSH:    Past Surgical History:  Procedure Laterality Date   aneurysm artery iliac common     COLONOSCOPY WITH PROPOFOL N/A 09/30/2021   Procedure: COLONOSCOPY WITH PROPOFOL;  Surgeon: [name], MD;  Location: [facility] ENDOSCOPY;  Service: Endoscopy;  Laterality: N/A;   heart ablation     INSERT / REPLACE / REMOVE PACEMAKER     PPM GENERATOR CHANGEOUT N/A 09/22/2022   Procedure: PPM GENERATOR CHANGEOUT;  Surgeon: [name], MD;  Location: [facility] INVASIVE CV LAB;  Service: Cardiovascular;  Laterality: N/A;    Current Outpatient Medications  Medication Sig Dispense Refill   amoxicillin (AMOXIL) 500 MG tablet Take by mouth. Take 4 tablets an hour before prior to dental procedures     apixaban (ELIQUIS) 5 MG TABS tablet Take 1 tablet (5 mg total) by mouth 2 (two) times daily. 180 tablet 1   cyanocobalamin (VITAMIN B12) 1000 MCG tablet Take 1,000 mcg by mouth daily with supper.     diltiazem (CARDIZEM) 30 MG tablet Take 30 mg by mouth 2 (two) times daily as needed (afib).     ezetimibe (ZETIA) 10 MG tablet Take 1 tablet (10 mg total)  by mouth daily. 90 tablet 3   levothyroxine (SYNTHROID) 88 MCG tablet Take 88 mcg by mouth every morning.     metoprolol succinate (TOPROL XL) 25 MG 24 hr tablet Take 1 tablet (25 mg total) by mouth daily. 90 tablet 3   Polyethyl Glycol-Propyl Glycol (SYSTANE ULTRA) 0.4-0.3 % SOLN Place 1 drop into both eyes daily as needed (dry eyes).     rosuvastatin (CRESTOR) 20 MG tablet Take 1 tablet (20 mg total) by  mouth daily. 90 tablet 3   zolpidem (AMBIEN) 10 MG tablet Take 10 mg by mouth at bedtime as needed for sleep.     acetaminophen (TYLENOL) 500 MG tablet Take 1,000 mg by mouth every 8 (eight) hours as needed for moderate pain. (Patient not taking: Reported on 03/23/2023)     No current facility-administered medications for this visit.     Allergies:   Patient has no known allergies.   Social History:  The patient  reports that he quit smoking about 48 years ago. His smoking use included cigarettes. He started smoking about 78 years ago. He has a 30 pack-year smoking history. He has never used smokeless tobacco. He reports current alcohol use of about 2.0 standard drinks of alcohol per week. He reports that he does not use drugs.   Family History:   family history includes Angina in his mother; Anuerysm in his brother; Heart attack (age of onset: 65) in his brother; Heart attack (age of onset: 69) in his father; Heart attack (age of onset: 65) in his maternal grandfather; Heart disease in his brother and father.    Review of Systems: Review of Systems  Constitutional: Negative.   HENT: Negative.    Respiratory: Negative.    Cardiovascular: Negative.   Gastrointestinal: Negative.   Musculoskeletal: Negative.   Neurological: Negative.   Psychiatric/Behavioral: Negative.    All other systems reviewed and are negative.   PHYSICAL EXAM: VS:  BP 100/68 (BP Location: Left Arm, Patient Position: Sitting, Cuff Size: Normal)   Pulse (!) 53   Ht 6\' 1"  (1.854 m)   Wt 164 lb 6 oz (74.6 kg)   SpO2 97%   BMI 21.69 kg/m  , BMI Body mass index is 21.69 kg/m. Constitutional:  oriented to person, place, and time. No distress.  HENT:  Head: Grossly normal Eyes:  no discharge. No scleral icterus.  Neck: No JVD, no carotid bruits  Cardiovascular: Regular rate and rhythm, no murmurs appreciated Pulmonary/Chest: Clear to auscultation bilaterally, no wheezes or rails Abdominal: Soft.  no distension.   no tenderness.  Musculoskeletal: Normal range of motion Neurological:  normal muscle tone. Coordination normal. No atrophy Skin: Skin warm and dry Psychiatric: normal affect, pleasant   Recent Labs: 09/18/2022: Hemoglobin 14.7; Platelets 179 02/23/2023: ALT 88; BUN 13; Creatinine, Ser 0.79; Magnesium 2.4; Potassium 4.4; Sodium 140    Lipid Panel Lab Results  Component Value Date   CHOL 121 02/23/2023   HDL 48 02/23/2023   LDLCALC 56 02/23/2023   TRIG 85 02/23/2023    Wt Readings from Last 3 Encounters:  03/23/23 164 lb 6 oz (74.6 kg)  01/23/23 165 lb 12.8 oz (75.2 kg)  01/08/23 164 lb (74.4 kg)     ASSESSMENT AND PLAN:  Problem List Items Addressed This Visit   None Visit Diagnoses     Paroxysmal atrial fibrillation (HCC)    -  Primary   Relevant Orders   EKG 12-Lead (Completed)   Hyperlipidemia, unspecified hyperlipidemia type  Ascending aorta dilation (HCC)       Relevant Orders   EKG 12-Lead (Completed)   NSVT (nonsustained ventricular tachycardia) (HCC)       Orthostasis       Coronary artery disease involving native coronary artery of native heart without angina pectoris       Relevant Orders   EKG 12-Lead (Completed)   Cardiac pacemaker in situ       Relevant Orders   EKG 12-Lead (Completed)   Primary hypertension       Relevant Orders   EKG 12-Lead (Completed)   Elevated coronary artery calcium score          Coronary artery disease with stable angina Shortness of breath and fatigue with heavy exertion, he is concerned for ischemia Recent stress test with apical defect Calcium score greater than 5000 Nonsustained VT on pacer download Given the above, we have recommended left heart catheterization for further evaluation Of note he has been seen by Dr. Kizzie Bane at [facility] for his ascending aorta dilation at the sinus of Valsalva measuring 5.1 cm, 4.7 cm in the ascending aorta  Paroxysmal atrial fibrillation Prior ablation, followed by EP, maintaining  normal sinus rhythm,  Concerned about side effects from metoprolol, fatigue, blood pressure running low recommend he try metoprolol succinate 12.5 daily  Dilated ascending aorta and root In the setting of bicuspid aortic valve Followed by Dr. Kizzie Bane at [facility] Repeat imaging scheduled in 1 year  Hyperlipidemia Will repeat lipids in 1 month on Crestor 20 with Zetia 10      Total encounter time more than 40 minutes  Greater than 50% was spent in counseling and coordination of care with the patient    Signed, Dossie Arbour, M.D., Ph.D. [facility] [place], [place] [phone]

## 2023-03-20 NOTE — Telephone Encounter (Signed)
The patient's wife stated that the patient saw Dr. Kizzie Bane at [facility]. He stated that the patient was advised surgery was not needed at this time but he does recommend a cardiac cath to assess his arteries.   An appointment has been made with Dr. Mariah Milling on 9/9 to discuss. The patient is currently asymptomatic.

## 2023-03-20 NOTE — H&P (View-Only) (Signed)
Cardiology Office Note  Date:  03/23/2023   ID:  Todd Lucas, DOB 10-Jan-1941, MRN [id number]  PCP:  [name], MD   Chief Complaint  Patient presents with   Discuss Cardic Cath due to calcium score    "Doing well." Medications reviewed by the patient verbally.     HPI:  Todd Lucas is a 82 y.o. 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  Cath 2014: Mild nonobstructive disease Chest CTA at [facility] detailing aorta 5.1 cm at sinus of Valsalva, 4.7 cm at maximum size of ascending aorta Who presents for follow-up of his atrial fibrillation, aortic valve disease, dilated ascending aorta   He is with his wife today in clinic.  Long discussion concerning calcium score in the 5000 range He has been evaluated by vascular at [facility], no plans at this time for aorta surgery for dilated aorta measuring 5.1 cm at the sinus of Valsalva  Reports that he feels tired, concerned about underlying ischemia Nonsustained VT noted on pacer downloads  Recent Myoview reviewed with grossly homogeneous tracer uptake, apical region with perfusion defect  Cholesterol at goal on Crestor 40 with Zetia 10 daily Due to mildly elevated LFTs, Crestor cut back to 20 and Tylenol held Repeat lab work pending  EKG personally reviewed by myself on todays visit EKG Interpretation Date/Time:  Monday March 23 2023 15:35:01 EDT Ventricular Rate:  53 PR Interval:  140 QRS Duration:  194 QT Interval:  500 QTC Calculation: 469 R Axis:   -89  Text Interpretation: AV dual-paced rhythm No previous ECGs available Confirmed by [name] [phone]) on 03/23/2023 3:50:57 PM  ,  PMH:   has a past  medical history of Acquired dilation of ascending aorta and aortic root (HCC), Actinic keratosis (09/17/2022), Basal cell carcinoma (08/04/2018), Basal cell carcinoma (09/17/2022), Coronary artery calcification seen on CT scan, Heart block, Heart murmur, basal cell carcinoma (05/26/2018), 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  PSH:    Past Surgical History:  Procedure Laterality Date   aneurysm artery iliac common     COLONOSCOPY WITH PROPOFOL N/A 09/30/2021   Procedure: COLONOSCOPY WITH PROPOFOL;  Surgeon: [name], MD;  Location: [facility] ENDOSCOPY;  Service: Endoscopy;  Laterality: N/A;   heart ablation     INSERT / REPLACE / REMOVE PACEMAKER     PPM GENERATOR CHANGEOUT N/A 09/22/2022   Procedure: PPM GENERATOR CHANGEOUT;  Surgeon: [name], MD;  Location: [facility] INVASIVE CV LAB;  Service: Cardiovascular;  Laterality: N/A;    Current Outpatient Medications  Medication Sig Dispense Refill   amoxicillin (AMOXIL) 500 MG tablet Take by mouth. Take 4 tablets an hour before prior to dental procedures     apixaban (ELIQUIS) 5 MG TABS tablet Take 1 tablet (5 mg total) by mouth 2 (two) times daily. 180 tablet 1   cyanocobalamin (VITAMIN B12) 1000 MCG tablet Take 1,000 mcg by mouth daily with supper.     diltiazem (CARDIZEM) 30 MG tablet Take 30 mg by mouth 2 (two) times daily as needed (afib).     ezetimibe (ZETIA) 10 MG tablet Take 1 tablet (10 mg total)  by mouth daily. 90 tablet 3   levothyroxine (SYNTHROID) 88 MCG tablet Take 88 mcg by mouth every morning.     metoprolol succinate (TOPROL XL) 25 MG 24 hr tablet Take 1 tablet (25 mg total) by mouth daily. 90 tablet 3   Polyethyl Glycol-Propyl Glycol (SYSTANE ULTRA) 0.4-0.3 % SOLN Place 1 drop into both eyes daily as needed (dry eyes).     rosuvastatin (CRESTOR) 20 MG tablet Take 1 tablet (20 mg total) by  mouth daily. 90 tablet 3   zolpidem (AMBIEN) 10 MG tablet Take 10 mg by mouth at bedtime as needed for sleep.     acetaminophen (TYLENOL) 500 MG tablet Take 1,000 mg by mouth every 8 (eight) hours as needed for moderate pain. (Patient not taking: Reported on 03/23/2023)     No current facility-administered medications for this visit.     Allergies:   Patient has no known allergies.   Social History:  The patient  reports that he quit smoking about 48 years ago. His smoking use included cigarettes. He started smoking about 78 years ago. He has a 30 pack-year smoking history. He has never used smokeless tobacco. He reports current alcohol use of about 2.0 standard drinks of alcohol per week. He reports that he does not use drugs.   Family History:   family history includes Angina in his mother; Anuerysm in his brother; Heart attack (age of onset: 96) in his brother; Heart attack (age of onset: 34) in his father; Heart attack (age of onset: 32) in his maternal grandfather; Heart disease in his brother and father.    Review of Systems: Review of Systems  Constitutional: Negative.   HENT: Negative.    Respiratory: Negative.    Cardiovascular: Negative.   Gastrointestinal: Negative.   Musculoskeletal: Negative.   Neurological: Negative.   Psychiatric/Behavioral: Negative.    All other systems reviewed and are negative.   PHYSICAL EXAM: VS:  BP 100/68 (BP Location: Left Arm, Patient Position: Sitting, Cuff Size: Normal)   Pulse (!) 53   Ht 6\' 1"  (1.854 m)   Wt 164 lb 6 oz (74.6 kg)   SpO2 97%   BMI 21.69 kg/m  , BMI Body mass index is 21.69 kg/m. Constitutional:  oriented to person, place, and time. No distress.  HENT:  Head: Grossly normal Eyes:  no discharge. No scleral icterus.  Neck: No JVD, no carotid bruits  Cardiovascular: Regular rate and rhythm, no murmurs appreciated Pulmonary/Chest: Clear to auscultation bilaterally, no wheezes or rails Abdominal: Soft.  no distension.   no tenderness.  Musculoskeletal: Normal range of motion Neurological:  normal muscle tone. Coordination normal. No atrophy Skin: Skin warm and dry Psychiatric: normal affect, pleasant   Recent Labs: 09/18/2022: Hemoglobin 14.7; Platelets 179 02/23/2023: ALT 88; BUN 13; Creatinine, Ser 0.79; Magnesium 2.4; Potassium 4.4; Sodium 140    Lipid Panel Lab Results  Component Value Date   CHOL 121 02/23/2023   HDL 48 02/23/2023   LDLCALC 56 02/23/2023   TRIG 85 02/23/2023    Wt Readings from Last 3 Encounters:  03/23/23 164 lb 6 oz (74.6 kg)  01/23/23 165 lb 12.8 oz (75.2 kg)  01/08/23 164 lb (74.4 kg)     ASSESSMENT AND PLAN:  Problem List Items Addressed This Visit   None Visit Diagnoses     Paroxysmal atrial fibrillation (HCC)    -  Primary   Relevant Orders   EKG 12-Lead (Completed)   Hyperlipidemia, unspecified hyperlipidemia type  Ascending aorta dilation (HCC)       Relevant Orders   EKG 12-Lead (Completed)   NSVT (nonsustained ventricular tachycardia) (HCC)       Orthostasis       Coronary artery disease involving native coronary artery of native heart without angina pectoris       Relevant Orders   EKG 12-Lead (Completed)   Cardiac pacemaker in situ       Relevant Orders   EKG 12-Lead (Completed)   Primary hypertension       Relevant Orders   EKG 12-Lead (Completed)   Elevated coronary artery calcium score          Coronary artery disease with stable angina Shortness of breath and fatigue with heavy exertion, he is concerned for ischemia Recent stress test with apical defect Calcium score greater than 5000 Nonsustained VT on pacer download Given the above, we have recommended left heart catheterization for further evaluation Of note he has been seen by Dr. Kizzie Bane at [facility] for his ascending aorta dilation at the sinus of Valsalva measuring 5.1 cm, 4.7 cm in the ascending aorta  Paroxysmal atrial fibrillation Prior ablation, followed by EP, maintaining  normal sinus rhythm,  Concerned about side effects from metoprolol, fatigue, blood pressure running low recommend he try metoprolol succinate 12.5 daily  Dilated ascending aorta and root In the setting of bicuspid aortic valve Followed by Dr. Kizzie Bane at [facility] Repeat imaging scheduled in 1 year  Hyperlipidemia Will repeat lipids in 1 month on Crestor 20 with Zetia 10      Total encounter time more than 40 minutes  Greater than 50% was spent in counseling and coordination of care with the patient    Signed, Dossie Arbour, M.D., Ph.D. [facility] [place], [place] [phone]

## 2023-03-20 NOTE — Telephone Encounter (Signed)
Patient's wife states Dr. Kizzie Bane with [facility] recommends a heart cath because patient had a calcium score of 5000+. She would like a call back to discuss.

## 2023-03-23 ENCOUNTER — Encounter: Payer: Self-pay | Admitting: Cardiovascular Disease

## 2023-03-23 ENCOUNTER — Ambulatory Visit: Payer: Medicare Other | Attending: Cardiovascular Disease | Admitting: Cardiovascular Disease

## 2023-03-23 ENCOUNTER — Other Ambulatory Visit
Admission: RE | Admit: 2023-03-23 | Discharge: 2023-03-23 | Disposition: A | Discharge status: Home / Self Care | Payer: Medicare Other | Source: Ambulatory Visit | Attending: Cardiovascular Disease | Admitting: Cardiovascular Disease

## 2023-03-23 VITALS — BP 100/68 | HR 53 | Ht 73.0 in | Wt 164.4 lb

## 2023-03-23 DIAGNOSIS — Z950 Presence of cardiac pacemaker: Secondary | ICD-10-CM | POA: Insufficient documentation

## 2023-03-23 DIAGNOSIS — I10 Essential (primary) hypertension: Secondary | ICD-10-CM | POA: Diagnosis present

## 2023-03-23 DIAGNOSIS — R931 Abnormal findings on diagnostic imaging of heart and coronary circulation: Secondary | ICD-10-CM | POA: Insufficient documentation

## 2023-03-23 DIAGNOSIS — I951 Orthostatic hypotension: Secondary | ICD-10-CM | POA: Diagnosis present

## 2023-03-23 DIAGNOSIS — E785 Hyperlipidemia, unspecified: Secondary | ICD-10-CM | POA: Insufficient documentation

## 2023-03-23 DIAGNOSIS — Z79899 Other long term (current) drug therapy: Secondary | ICD-10-CM | POA: Insufficient documentation

## 2023-03-23 DIAGNOSIS — I77810 Thoracic aortic ectasia: Secondary | ICD-10-CM | POA: Insufficient documentation

## 2023-03-23 DIAGNOSIS — I2510 Atherosclerotic heart disease of native coronary artery without angina pectoris: Secondary | ICD-10-CM | POA: Insufficient documentation

## 2023-03-23 DIAGNOSIS — I4729 Other ventricular tachycardia: Secondary | ICD-10-CM | POA: Insufficient documentation

## 2023-03-23 DIAGNOSIS — I480 Paroxysmal atrial fibrillation: Secondary | ICD-10-CM | POA: Diagnosis not present

## 2023-03-23 LAB — CBC
HCT: 40.8 % (ref 39.0–52.0)
Hemoglobin: 14 g/dL (ref 13.0–17.0)
MCH: 33 pg (ref 26.0–34.0)
MCHC: 34.3 g/dL (ref 30.0–36.0)
MCV: 96.2 fL (ref 80.0–100.0)
Platelets: 144 10*3/uL — ABNORMAL LOW (ref 150–400)
RBC: 4.24 MIL/uL (ref 4.22–5.81)
RDW: 12.7 % (ref 11.5–15.5)
WBC: 3.8 10*3/uL — ABNORMAL LOW (ref 4.0–10.5)
nRBC: 0 % (ref 0.0–0.2)

## 2023-03-23 LAB — BASIC METABOLIC PANEL
Anion gap: 8 (ref 5–15)
BUN: 12 mg/dL (ref 8–23)
CO2: 28 mmol/L (ref 22–32)
Calcium: 9.2 mg/dL (ref 8.9–10.3)
Chloride: 102 mmol/L (ref 98–111)
Creatinine, Ser: 0.74 mg/dL (ref 0.61–1.24)
GFR, Estimated: >60 mL/min (ref >60)
Glucose, Bld: 101 mg/dL — ABNORMAL HIGH (ref 70–99)
Potassium: 4.2 mmol/L (ref 3.5–5.1)
Sodium: 138 mmol/L (ref 135–145)

## 2023-03-23 NOTE — Patient Instructions (Signed)
Medication Instructions:  No changes  If you need a refill on your cardiac medications before your next appointment, please call your pharmacy.   Lab work: Please go to [facility] to have the following labs drawn CBC and BMP. Go to the first desk to register.   Testing/Procedures:  Section [place] [facility] DEPT [facility] [place] [address] [place] [address] Dept: [phone] Loc: [phone]  Todd Lucas  03/23/2023  You are scheduled for a Cardiac Catheterization on Thursday, September 12 with Dr. [name].  1. Please arrive at the [facility] Vascular Center Entrance of [facility], [address], [place] [address] at 11:30 AM (This is 1 hour(s) prior to your procedure time).  Proceed to the Check-In Desk directly inside the entrance.  Procedure Parking: Use the entrance off of the [address] side of the hospital. Turn right upon entering and follow the driveway to parking that is directly in front of the Heart & Vascular Center. There is no valet parking available at this entrance, however there is an awning directly in front of the Heart & Vascular Center for drop off/ pick up for patients.  Special note: Every effort is made to have your procedure done on time. Please understand that emergencies sometimes delay scheduled procedures.  2. Diet: Do not eat solid foods after midnight.  The patient may have clear liquids until 5am upon the day of the procedure.   4. Medication instructions in preparation for your procedure:   Contrast Allergy: No   Stop taking Eliquis (Apixiban) on Tuesday, September 10.   On the morning of your procedure take any morning medicines NOT listed above.  You may use sips of water.  5. Plan to go home the same day, you will only stay overnight if medically necessary. 6. Bring a current list of your medications and current insurance cards. 7. You MUST  have a responsible person to drive you home. 8. Someone MUST be with you the first 24 hours after you arrive home or your discharge will be delayed. 9. Please wear clothes that are easy to get on and off and wear slip-on shoes.  Thank you for allowing [place] to care for you!   -- [facility]    Follow-Up: At [facility], you and your health needs are our priority.  As part of our continuing mission to provide you with exceptional heart care, we have created designated Provider Care Teams.  These Care Teams include your primary Cardiologist (physician) and Advanced Practice Providers (APPs -  Physician Assistants and Nurse Practitioners) who all work together to provide you with the care you need, when you need it.  You will need a follow up appointment in 6 months  Providers on your designated Care Team:   Nicolasa Ducking, NP Eula Listen, PA-C Cadence Fransico Michael, [place]  COVID-19 Vaccine Information can be found at: [email or web address] For questions related to vaccine distribution or appointments, please email vaccine@ [email or web address] or call [phone].

## 2023-03-24 NOTE — Telephone Encounter (Signed)
Patient seen in clinic by Dr. Mariah Milling 03/23/23 and all questions answered.

## 2023-03-25 ENCOUNTER — Other Ambulatory Visit: Payer: Self-pay | Admitting: Cardiovascular Disease

## 2023-03-25 DIAGNOSIS — I200 Unstable angina: Secondary | ICD-10-CM

## 2023-03-26 ENCOUNTER — Other Ambulatory Visit: Payer: Self-pay

## 2023-03-26 ENCOUNTER — Encounter
Admission: RE | Disposition: A | Discharge status: Home / Self Care | Payer: Self-pay | Source: Home / Self Care | Attending: Cardiology

## 2023-03-26 ENCOUNTER — Ambulatory Visit (INDEPENDENT_AMBULATORY_CARE_PROVIDER_SITE_OTHER): Payer: Medicare Other

## 2023-03-26 ENCOUNTER — Encounter: Payer: Self-pay | Admitting: Cardiology

## 2023-03-26 ENCOUNTER — Ambulatory Visit
Admission: RE | Admit: 2023-03-26 | Discharge: 2023-03-26 | Disposition: A | Discharge status: Home / Self Care | Payer: Medicare Other | Attending: Cardiology | Admitting: Cardiology

## 2023-03-26 DIAGNOSIS — Z950 Presence of cardiac pacemaker: Secondary | ICD-10-CM | POA: Diagnosis not present

## 2023-03-26 DIAGNOSIS — I7121 Aneurysm of the ascending aorta, without rupture: Secondary | ICD-10-CM | POA: Diagnosis present

## 2023-03-26 DIAGNOSIS — I480 Paroxysmal atrial fibrillation: Secondary | ICD-10-CM | POA: Diagnosis not present

## 2023-03-26 DIAGNOSIS — I25119 Atherosclerotic heart disease of native coronary artery with unspecified angina pectoris: Secondary | ICD-10-CM | POA: Diagnosis not present

## 2023-03-26 DIAGNOSIS — Q231 Congenital insufficiency of aortic valve: Secondary | ICD-10-CM | POA: Diagnosis not present

## 2023-03-26 DIAGNOSIS — Z79899 Other long term (current) drug therapy: Secondary | ICD-10-CM | POA: Insufficient documentation

## 2023-03-26 DIAGNOSIS — I10 Essential (primary) hypertension: Secondary | ICD-10-CM | POA: Insufficient documentation

## 2023-03-26 DIAGNOSIS — I77810 Thoracic aortic ectasia: Secondary | ICD-10-CM | POA: Diagnosis not present

## 2023-03-26 DIAGNOSIS — Z87891 Personal history of nicotine dependence: Secondary | ICD-10-CM | POA: Insufficient documentation

## 2023-03-26 DIAGNOSIS — E785 Hyperlipidemia, unspecified: Secondary | ICD-10-CM | POA: Diagnosis not present

## 2023-03-26 DIAGNOSIS — I200 Unstable angina: Secondary | ICD-10-CM

## 2023-03-26 DIAGNOSIS — Z7901 Long term (current) use of anticoagulants: Secondary | ICD-10-CM | POA: Insufficient documentation

## 2023-03-26 DIAGNOSIS — Z8249 Family history of ischemic heart disease and other diseases of the circulatory system: Secondary | ICD-10-CM | POA: Insufficient documentation

## 2023-03-26 DIAGNOSIS — I2089 Other forms of angina pectoris: Secondary | ICD-10-CM

## 2023-03-26 DIAGNOSIS — R931 Abnormal findings on diagnostic imaging of heart and coronary circulation: Secondary | ICD-10-CM

## 2023-03-26 DIAGNOSIS — I2584 Coronary atherosclerosis due to calcified coronary lesion: Secondary | ICD-10-CM | POA: Insufficient documentation

## 2023-03-26 HISTORY — PX: LEFT HEART CATH AND CORONARY ANGIOGRAPHY: CATH118249

## 2023-03-26 LAB — CUP PACEART REMOTE DEVICE CHECK
Battery Remaining Longevity: 152 mo
Battery Voltage: 3.15 V
Brady Statistic AP VP Percent: 70.94 %
Brady Statistic AP VS Percent: 0 %
Brady Statistic AS VP Percent: 29.05 %
Brady Statistic AS VS Percent: 0 %
Brady Statistic RA Percent Paced: 70.81 %
Brady Statistic RV Percent Paced: 100 %
Date Time Interrogation Session: 20240910190845
Implantable Pulse Generator Implant Date: 20240311
Lead Channel Impedance Value: 304 Ohm
Lead Channel Impedance Value: 380 Ohm
Lead Channel Impedance Value: 456 Ohm
Lead Channel Impedance Value: 608 Ohm
Lead Channel Pacing Threshold Amplitude: 0.625 V
Lead Channel Pacing Threshold Amplitude: 1.25 V
Lead Channel Pacing Threshold Pulse Width: 0.4 ms
Lead Channel Pacing Threshold Pulse Width: 0.4 ms
Lead Channel Sensing Intrinsic Amplitude: 2.625 mV
Lead Channel Sensing Intrinsic Amplitude: 2.625 mV
Lead Channel Sensing Intrinsic Amplitude: 31.625 mV
Lead Channel Sensing Intrinsic Amplitude: 31.625 mV
Lead Channel Setting Pacing Amplitude: 1 V
Lead Channel Setting Pacing Amplitude: 2 V
Lead Channel Setting Pacing Pulse Width: 0.4 ms
Lead Channel Setting Sensing Sensitivity: 1.2 mV
Zone Setting Status: 755011
Zone Setting Status: 755011

## 2023-03-26 SURGERY — LEFT HEART CATH AND CORONARY ANGIOGRAPHY
Anesthesia: Moderate Sedation | Laterality: Bilateral

## 2023-03-26 SURGERY — LEFT HEART CATH AND CORONARY ANGIOGRAPHY
Anesthesia: Moderate Sedation | Laterality: Left

## 2023-03-26 MED ORDER — SODIUM CHLORIDE 0.9 % WEIGHT BASED INFUSION
3.0000 mL/kg/h | INTRAVENOUS | Status: AC
  Administered 2023-03-26: 3 mL/kg/h via INTRAVENOUS

## 2023-03-26 MED ORDER — MIDAZOLAM HCL 2 MG/2ML IJ SOLN
INTRAMUSCULAR | Status: DC | PRN
  Administered 2023-03-26: 1 mg via INTRAVENOUS

## 2023-03-26 MED ORDER — LABETALOL HCL 5 MG/ML IV SOLN: 10.0000 mg | INTRAVENOUS | Status: DC | PRN

## 2023-03-26 MED ORDER — SODIUM CHLORIDE 0.9 % WEIGHT BASED INFUSION: 1.0000 mL/kg/h | INTRAVENOUS | Status: DC

## 2023-03-26 MED ORDER — FENTANYL CITRATE (PF) 100 MCG/2ML IJ SOLN
INTRAMUSCULAR | Status: AC
  Filled 2023-03-26: qty 2

## 2023-03-26 MED ORDER — FENTANYL CITRATE (PF) 100 MCG/2ML IJ SOLN
INTRAMUSCULAR | Status: DC | PRN
  Administered 2023-03-26: 25 ug via INTRAVENOUS

## 2023-03-26 MED ORDER — SODIUM CHLORIDE 0.9% FLUSH: 3.0000 mL | INTRAVENOUS | Status: DC | PRN

## 2023-03-26 MED ORDER — IOHEXOL 300 MG/ML  SOLN
INTRAMUSCULAR | Status: DC | PRN
  Administered 2023-03-26: 60 mL

## 2023-03-26 MED ORDER — SODIUM CHLORIDE 0.9 % IV SOLN: 250.0000 mL | INTRAVENOUS | Status: DC | PRN

## 2023-03-26 MED ORDER — HEPARIN (PORCINE) IN NACL 1000-0.9 UT/500ML-% IV SOLN
INTRAVENOUS | Status: AC
  Filled 2023-03-26: qty 1000

## 2023-03-26 MED ORDER — LIDOCAINE HCL 1 % IJ SOLN
INTRAMUSCULAR | Status: AC
  Filled 2023-03-26: qty 20

## 2023-03-26 MED ORDER — LIDOCAINE HCL (PF) 1 % IJ SOLN
INTRAMUSCULAR | Status: DC | PRN
  Administered 2023-03-26: 5 mL

## 2023-03-26 MED ORDER — HYDRALAZINE HCL 20 MG/ML IJ SOLN: 10.0000 mg | INTRAMUSCULAR | Status: DC | PRN

## 2023-03-26 MED ORDER — VERAPAMIL HCL 2.5 MG/ML IV SOLN
INTRAVENOUS | Status: DC | PRN
  Administered 2023-03-26: 2.5 mg via INTRAVENOUS

## 2023-03-26 MED ORDER — HEPARIN SODIUM (PORCINE) 1000 UNIT/ML IJ SOLN
INTRAMUSCULAR | Status: DC | PRN
  Administered 2023-03-26: 4000 [IU] via INTRAVENOUS

## 2023-03-26 MED ORDER — SODIUM CHLORIDE 0.9% FLUSH: 3.0000 mL | Freq: Two times a day (BID) | INTRAVENOUS | Status: DC

## 2023-03-26 MED ORDER — VERAPAMIL HCL 2.5 MG/ML IV SOLN
INTRAVENOUS | Status: AC
  Filled 2023-03-26: qty 2

## 2023-03-26 MED ORDER — ONDANSETRON HCL 4 MG/2ML IJ SOLN: 4.0000 mg | Freq: Four times a day (QID) | INTRAMUSCULAR | Status: DC | PRN

## 2023-03-26 MED ORDER — ASPIRIN 81 MG PO CHEW
81.0000 mg | CHEWABLE_TABLET | ORAL | Status: AC
  Administered 2023-03-26: 81 mg via ORAL

## 2023-03-26 MED ORDER — ASPIRIN 81 MG PO CHEW
CHEWABLE_TABLET | ORAL | Status: AC
  Filled 2023-03-26: qty 1

## 2023-03-26 MED ORDER — HEPARIN (PORCINE) IN NACL 2000-0.9 UNIT/L-% IV SOLN
INTRAVENOUS | Status: DC | PRN
  Administered 2023-03-26: 1000 mL

## 2023-03-26 MED ORDER — HEPARIN SODIUM (PORCINE) 1000 UNIT/ML IJ SOLN
INTRAMUSCULAR | Status: AC
  Filled 2023-03-26: qty 10

## 2023-03-26 MED ORDER — MIDAZOLAM HCL 2 MG/2ML IJ SOLN
INTRAMUSCULAR | Status: AC
  Filled 2023-03-26: qty 2

## 2023-03-26 MED ORDER — ACETAMINOPHEN 325 MG PO TABS: 650.0000 mg | ORAL_TABLET | ORAL | Status: DC | PRN

## 2023-03-26 SURGICAL SUPPLY — 14 items
CATH INFINITI 5FR AL1 (CATHETERS) IMPLANT
CATH INFINITI 5FR ANG PIGTAIL (CATHETERS) IMPLANT
CATH INFINITI 5FR JL4 (CATHETERS) IMPLANT
CATH INFINITI 5FR JL5 (CATHETERS) IMPLANT
CATH INFINITI AMBI 5FR TG (CATHETERS) IMPLANT
DEVICE RAD TR BAND REGULAR (VASCULAR PRODUCTS) IMPLANT
DRAPE BRACHIAL (DRAPES) IMPLANT
GLIDESHEATH SLEND SS 6F .021 (SHEATH) IMPLANT
GUIDEWIRE INQWIRE 1.5J.035X260 (WIRE) IMPLANT
INQWIRE 1.5J .035X260CM (WIRE) ×1
PACK CARDIAC CATH (CUSTOM PROCEDURE TRAY) ×1 IMPLANT
PROTECTION STATION PRESSURIZED (MISCELLANEOUS) ×1
SET ATX-X65L (MISCELLANEOUS) IMPLANT
STATION PROTECTION PRESSURIZED (MISCELLANEOUS) IMPLANT

## 2023-03-26 NOTE — Interval H&P Note (Signed)
History and Physical Interval Note:  03/26/2023 1:58 PM  Todd Lucas  has presented today for surgery, with the diagnosis of L Cath    Angina   Afib, Ascending Ao Aneurysm.  The various methods of treatment have been discussed with the patient and family. After consideration of risks, benefits and other options for treatment, the patient has consented to  Procedure(s): LEFT HEART CATH AND CORONARY ANGIOGRAPHY (Left)  PERCUTANEOUS CORONARY INTERVENTION   as a surgical intervention.  The patient's history has been reviewed, patient examined, no change in status, stable for surgery.  I have reviewed the patient's chart and labs.  Questions were answered to the patient's satisfaction.    Cath Lab Visit (complete for each Cath Lab visit)  Clinical Evaluation Leading to the Procedure:   ACS: No.  Non-ACS:    Anginal Classification: CCS II  Anti-ischemic medical therapy: Maximal Therapy (2 or more classes of medications)  Non-Invasive Test Results: Equivocal test results  Prior CABG: No previous CABG   Todd Lucas

## 2023-03-27 ENCOUNTER — Encounter: Payer: Self-pay | Admitting: Cardiology

## 2023-04-07 ENCOUNTER — Encounter: Payer: Self-pay | Admitting: Cardiovascular Disease

## 2023-04-07 NOTE — Progress Notes (Signed)
Remote pacemaker transmission.   

## 2023-04-29 ENCOUNTER — Telehealth: Payer: Self-pay | Admitting: Cardiovascular Disease

## 2023-04-29 NOTE — Telephone Encounter (Signed)
Patient calling to speak to the nurse about the appt on 10/18. Please advise

## 2023-04-29 NOTE — Telephone Encounter (Signed)
Wife (Viki) returned RN's call.

## 2023-04-29 NOTE — Telephone Encounter (Signed)
Spoke w/ pt's wife.  She states that pt saw Ward Givens on 01/23/23 where he advised pt to f/u in office in 3 months, which is sched for this Fri, 10/18. Pt had lipid panel drawn 02/23/23 and was recommended to have repeat LFTs in 4 weeks. However, at appt w/ Dr. Mariah Milling on 03/23/23, pt was advised not to have labs drawn, as it was too early and recommended pt wait "at least 3 months". Pt has not had any labs drawn and would like to know if these are needed prior to his appt on Friday. Advised her that I will clarify w/ Dr. Mariah Milling if he wants these done prior to pt's appt or if he wants him to wait.

## 2023-04-29 NOTE — Telephone Encounter (Signed)
Left message for pt to call back  °

## 2023-04-29 NOTE — Telephone Encounter (Signed)
Attempted to contact pt's wife, but phone went straight to vm. Left message advising her that I am calling from a blocked # and to ask the operator to transfer the call to me when she calls back.

## 2023-05-01 ENCOUNTER — Encounter: Payer: Self-pay | Admitting: Cardiovascular Disease

## 2023-05-01 ENCOUNTER — Ambulatory Visit: Payer: Medicare Other | Attending: Cardiovascular Disease | Admitting: Cardiovascular Disease

## 2023-05-01 ENCOUNTER — Other Ambulatory Visit: Payer: Self-pay | Admitting: Emergency Medicine

## 2023-05-01 VITALS — BP 100/60 | HR 55 | Ht 74.0 in | Wt 162.4 lb

## 2023-05-01 DIAGNOSIS — E785 Hyperlipidemia, unspecified: Secondary | ICD-10-CM

## 2023-05-01 DIAGNOSIS — R931 Abnormal findings on diagnostic imaging of heart and coronary circulation: Secondary | ICD-10-CM | POA: Diagnosis not present

## 2023-05-01 DIAGNOSIS — E782 Mixed hyperlipidemia: Secondary | ICD-10-CM | POA: Insufficient documentation

## 2023-05-01 DIAGNOSIS — I2089 Other forms of angina pectoris: Secondary | ICD-10-CM | POA: Diagnosis present

## 2023-05-01 DIAGNOSIS — Z79899 Other long term (current) drug therapy: Secondary | ICD-10-CM | POA: Insufficient documentation

## 2023-05-01 DIAGNOSIS — I480 Paroxysmal atrial fibrillation: Secondary | ICD-10-CM

## 2023-05-01 NOTE — Progress Notes (Signed)
Cardiology Office Note  Date:  05/01/2023   ID:  Todd Lucas, DOB 1941-03-07, MRN [id number]  PCP:  [name], MD   Chief Complaint  Patient presents with   Follow-up    Patient was at [facility] s/p cardiac cath. Medications reviewed by the patient verbally. "Doing well."     HPI:  Todd Lucas is a 82 y.o. 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  Cath 2014: Mild nonobstructive disease Repeat catheterization September 2024, heavily calcified with nonobstructive disease Chest CTA at [facility] detailing aorta 5.1 cm at sinus of Valsalva, 4.7 cm at maximum size of ascending aorta Who presents for follow-up of his atrial fibrillation, aortic valve disease, dilated ascending aorta   Last seen by myself in clinic September 2024 He is with his wife today in clinic.   calcium score in the 5000 range evaluated by vascular at [facility] for dilated aorta, no immediate plans for aorta surgery for dilated aorta measuring 5.1 cm at the sinus of Valsalva Vascular recommended cardiac catheterization given elevated calcium score  Low risk stress test July 2024  grossly homogeneous tracer uptake, apical region with perfusion defect  Cardiac cath 03/26/23 Images pulled up and reviewed Angiographically heavily calcified vessels with nonobstructive disease  Cholesterol at goal on Crestor 40 with Zetia 10 daily Due to mildly elevated LFTs, Crestor cut back to 20 and Tylenol held Repeat lab work pending  EKG personally reviewed by myself on todays visit EKG Interpretation Date/Time:  Friday May 01 2023 14:20:50 EDT Ventricular Rate:  55 PR Interval:  130 QRS  Duration:  184 QT Interval:  488 QTC Calculation: 466 R Axis:   -88  Text Interpretation: Atrial-sensed ventricular-paced rhythm When compared with ECG of 23-Mar-2023 15:35, Vent. 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  PSH:    Past Surgical History:  Procedure Laterality Date   aneurysm artery iliac common     COLONOSCOPY WITH PROPOFOL N/A 09/30/2021   Procedure: COLONOSCOPY WITH PROPOFOL;  Surgeon: [name], MD;  Location: [facility] ENDOSCOPY;  Service: Endoscopy;  Laterality: N/A;   heart ablation     INSERT / REPLACE / REMOVE PACEMAKER     LEFT HEART CATH AND CORONARY ANGIOGRAPHY Left 03/26/2023   Procedure: LEFT HEART CATH AND CORONARY ANGIOGRAPHY;  Surgeon: Marykay Lex, MD;  Location: [facility] INVASIVE CV LAB;  Service: Cardiovascular;  Laterality: Left;   PPM GENERATOR CHANGEOUT N/A 09/22/2022   Procedure: PPM GENERATOR CHANGEOUT;  Surgeon: [name], MD;  Location: [facility] INVASIVE CV LAB;  Service: Cardiovascular;  Laterality: N/A;    Current Outpatient Medications  Medication Sig Dispense Refill   amoxicillin (AMOXIL) 500 MG tablet Take by mouth. Take 4 tablets an hour before prior to dental procedures  apixaban (ELIQUIS) 5 MG TABS tablet Take 1 tablet (5 mg total) by mouth 2 (two) times daily. 180 tablet 1   cyanocobalamin (VITAMIN B12) 1000 MCG tablet Take 1,000 mcg by mouth daily with supper.     diltiazem (CARDIZEM) 30 MG tablet Take 30 mg  by mouth 2 (two) times daily as needed (afib).     ezetimibe (ZETIA) 10 MG tablet Take 1 tablet (10 mg total) by mouth daily. 90 tablet 3   levothyroxine (SYNTHROID) 88 MCG tablet Take 88 mcg by mouth every morning.     metoprolol succinate (TOPROL-XL) 25 MG 24 hr tablet Take 12.5 mg by mouth daily.     Polyethyl Glycol-Propyl Glycol (SYSTANE ULTRA) 0.4-0.3 % SOLN Place 1 drop into both eyes daily as needed (dry eyes).     rosuvastatin (CRESTOR) 20 MG tablet Take 1 tablet (20 mg total) by mouth daily. 90 tablet 3   zolpidem (AMBIEN) 10 MG tablet Take 10 mg by mouth at bedtime as needed for sleep.     acetaminophen (TYLENOL) 500 MG tablet Take 1,000 mg by mouth every 8 (eight) hours as needed for moderate pain (pain score 4-6). (Patient not taking: Reported on 05/01/2023)     No current facility-administered medications for this visit.     Allergies:   Patient has no known allergies.   Social History:  The patient  reports that he quit smoking about 48 years ago. His smoking use included cigarettes. He started smoking about 78 years ago. He has a 30 pack-year smoking history. He has never used smokeless tobacco. He reports current alcohol use of about 2.0 standard drinks of alcohol per week. He reports that he does not use drugs.   Family History:   family history includes Angina in his mother; Anuerysm in his brother; Heart attack (age of onset: 57) in his brother; Heart attack (age of onset: 65) in his father; Heart attack (age of onset: 29) in his maternal grandfather; Heart disease in his brother and father.    Review of Systems: Review of Systems  Constitutional: Negative.   HENT: Negative.    Respiratory: Negative.    Cardiovascular: Negative.   Gastrointestinal: Negative.   Musculoskeletal: Negative.   Neurological: Negative.   Psychiatric/Behavioral: Negative.    All other systems reviewed and are negative.   PHYSICAL EXAM: VS:  Ht 6\' 2"  (1.88 m)   Wt 162 lb 6 oz (73.7 kg)    BMI 20.85 kg/m  , BMI Body mass index is 20.85 kg/m. Constitutional:  oriented to person, place, and time. No distress.  HENT:  Head: Grossly normal Eyes:  no discharge. No scleral icterus.  Neck: No JVD, no carotid bruits  Cardiovascular: Regular rate and rhythm, no murmurs appreciated Pulmonary/Chest: Clear to auscultation bilaterally, no wheezes or rails Abdominal: Soft.  no distension.  no tenderness.  Musculoskeletal: Normal range of motion Neurological:  normal muscle tone. Coordination normal. No atrophy Skin: Skin warm and dry Psychiatric: normal affect, pleasant  Recent Labs: 02/23/2023: ALT 88; Magnesium 2.4 03/23/2023: BUN 12; Creatinine, Ser 0.74; Hemoglobin 14.0; Platelets 144; Potassium 4.2; Sodium 138    Lipid Panel Lab Results  Component Value Date   CHOL 121 02/23/2023   HDL 48 02/23/2023   LDLCALC 56 02/23/2023   TRIG 85 02/23/2023    Wt Readings from Last 3 Encounters:  05/01/23 162 lb 6 oz (73.7 kg)  03/26/23 165 lb (74.8 kg)  03/23/23 164 lb 6 oz (74.6 kg)  ASSESSMENT AND PLAN:  Problem List Items Addressed This Visit       Cardiology Problems   Hyperlipidemia   Relevant Medications   metoprolol succinate (TOPROL-XL) 25 MG 24 hr tablet   Elevated coronary artery calcium score   Relevant Medications   metoprolol succinate (TOPROL-XL) 25 MG 24 hr tablet   Other Relevant Orders   EKG 12-Lead   Atypical angina (HCC) - Primary   Relevant Medications   metoprolol succinate (TOPROL-XL) 25 MG 24 hr tablet   Other Relevant Orders   EKG 12-Lead    Coronary artery disease with stable angina Calcium score 5000 Low risk Myoview Cardiac catheterization performed September 2024 with heavy three-vessel coronary calcification, nonobstructive disease, no intervention performed -Recommend repeat lipid panel in several weeks time on reduced dose Crestor 20 with Zetia 10  Paroxysmal atrial fibrillation Prior ablation, followed by EP, maintaining  normal sinus rhythm,  Continue metoprolol succinate 12.5 daily if tolerated, blood pressure low but denies orthostasis  Dilated ascending aorta and root In the setting of bicuspid aortic valve Followed by Dr. Kizzie Bane at [facility] Repeat imaging scheduled in 1 year Low blood pressure  Hyperlipidemia Continue Crestor 20 with Zetia 10 LFTs and lipid panel in several weeks time Crestor was reduced from 40 given climbing LFTs    Signed, Dossie Arbour, M.D., Ph.D. [facility] [place], [place] [phone]

## 2023-05-01 NOTE — Patient Instructions (Addendum)
Medication Instructions:  No changes  If you need a refill on your cardiac medications before your next appointment, please call your pharmacy.   Lab work: Your provider would like for you to return to have the following labs drawn: Lipid Panel.   Please go to [facility] [address] ([facility] [address], [place] [address] You do not need an appointment.  They are open from 7:30 am-4 pm.  Lunch from 1:00 pm- 2:00 pm You will need to be fasting.     Testing/Procedures: No new testing needed  Follow-Up: At [facility], you and your health needs are our priority.  As part of our continuing mission to provide you with exceptional heart care, we have created designated Provider Care Teams.  These Care Teams include your primary Cardiologist (physician) and Advanced Practice Providers (APPs -  Physician Assistants and Nurse Practitioners) who all work together to provide you with the care you need, when you need it.  You will need a follow up appointment in 12 months  Providers on your designated Care Team:   Nicolasa Ducking, NP Eula Listen, PA-C Cadence Fransico Michael, [place]  COVID-19 Vaccine Information can be found at: [email or web address] For questions related to vaccine distribution or appointments, please email [email or web address] or call [phone].

## 2023-05-02 ENCOUNTER — Encounter: Payer: Self-pay | Admitting: Cardiovascular Disease

## 2023-05-04 ENCOUNTER — Other Ambulatory Visit: Payer: Self-pay

## 2023-05-04 MED ORDER — DILTIAZEM HCL 30 MG PO TABS: 30.0000 mg | ORAL_TABLET | Freq: Two times a day (BID) | ORAL | 1 refills | Status: AC | PRN

## 2023-05-16 LAB — LIPID PANEL
Chol/HDL Ratio: 2.7 {ratio} (ref 0.0–5.0)
Cholesterol, Total: 148 mg/dL (ref 100–199)
HDL: 55 mg/dL (ref >39)
LDL Chol Calc (NIH): 74 mg/dL (ref 0–99)
Triglycerides: 101 mg/dL (ref 0–149)
VLDL Cholesterol Cal: 19 mg/dL (ref 5–40)

## 2023-05-17 ENCOUNTER — Encounter: Payer: Self-pay | Admitting: Cardiovascular Disease

## 2023-05-17 DIAGNOSIS — Z79899 Other long term (current) drug therapy: Secondary | ICD-10-CM

## 2023-05-18 NOTE — Telephone Encounter (Signed)
Called and spoke with patient. Informed him that his LFTs were not drawn at last visit.  Patient verbalizes understanding. Patient requesting to have labs drawn at [facility] instead of [facility]. New order placed for LFTs at [facility].

## 2023-05-29 ENCOUNTER — Other Ambulatory Visit
Admission: RE | Admit: 2023-05-29 | Discharge: 2023-05-29 | Disposition: A | Discharge status: Home / Self Care | Payer: Medicare Other | Attending: Cardiovascular Disease | Admitting: Cardiovascular Disease

## 2023-05-29 DIAGNOSIS — Z79899 Other long term (current) drug therapy: Secondary | ICD-10-CM | POA: Insufficient documentation

## 2023-05-29 LAB — HEPATIC FUNCTION PANEL
ALT: 39 U/L (ref 0–44)
AST: 35 U/L (ref 15–41)
Albumin: 4 g/dL (ref 3.5–5.0)
Alkaline Phosphatase: 58 U/L (ref 38–126)
Bilirubin, Direct: 0.1 mg/dL (ref 0.0–0.2)
Indirect Bilirubin: 0.5 mg/dL (ref 0.3–0.9)
Total Bilirubin: 0.6 mg/dL (ref <1.2)
Total Protein: 6.4 g/dL — ABNORMAL LOW (ref 6.5–8.1)

## 2023-06-04 ENCOUNTER — Encounter: Payer: Self-pay | Admitting: Cardiovascular Disease

## 2023-06-05 MED ORDER — REPATHA SURECLICK 140 MG/ML ~~LOC~~ SOAJ: 140.0000 mg | SUBCUTANEOUS | 3 refills | Status: DC

## 2023-06-05 NOTE — Telephone Encounter (Signed)
Called and spoke with patient. Informed him of the following from Dr. Mariah Milling.  We can send in request /prescription for Praluent 150 every 2 weeks or Repatha 140 subcu every 2 weeks which ever is covered by insurance Thx TGollan   Patient verbalizes understanding. Patient states that he prefers Repatha. Prescription sent to preferred pharmacy.

## 2023-06-08 ENCOUNTER — Telehealth: Payer: Self-pay | Admitting: Cardiovascular Disease

## 2023-06-08 NOTE — Telephone Encounter (Signed)
Pt states the Repatha needs prior auth

## 2023-06-09 ENCOUNTER — Other Ambulatory Visit (HOSPITAL_COMMUNITY): Payer: Self-pay

## 2023-06-09 ENCOUNTER — Telehealth: Payer: Self-pay | Admitting: Pharmacy Technician

## 2023-06-09 NOTE — Telephone Encounter (Signed)
Pharmacy Patient Advocate Encounter   Received notification from Pt Calls Messages that prior authorization for repatha is required/requested.   Insurance verification completed.   The patient is insured through [employer] .   Per test claim: PA required; PA submitted to above mentioned insurance via CoverMyMeds Key/confirmation #/EOC [facility] Status is pending

## 2023-06-10 ENCOUNTER — Other Ambulatory Visit: Payer: Self-pay | Admitting: *Deleted

## 2023-06-10 MED ORDER — EZETIMIBE 10 MG PO TABS: 10.0000 mg | ORAL_TABLET | Freq: Every day | ORAL | 3 refills | Status: DC

## 2023-06-10 NOTE — Telephone Encounter (Signed)
Called patient and informed him that his PA has been approved:  "Received notification from [employer] that Prior Authorization for repatha has been APPROVED from 05/11/23 to 06/09/24    PA #/Case ID/Reference #: [id number]"  Patient understood with read back

## 2023-06-10 NOTE — Telephone Encounter (Signed)
Pharmacy Patient Advocate Encounter  Received notification from [employer] that Prior Authorization for repatha has been APPROVED from 05/11/23 to 06/09/24   PA #/Case ID/Reference #: [id number]

## 2023-06-25 ENCOUNTER — Ambulatory Visit (INDEPENDENT_AMBULATORY_CARE_PROVIDER_SITE_OTHER): Payer: Medicare Other

## 2023-06-25 DIAGNOSIS — I480 Paroxysmal atrial fibrillation: Secondary | ICD-10-CM | POA: Diagnosis not present

## 2023-06-25 LAB — CUP PACEART REMOTE DEVICE CHECK
Battery Remaining Longevity: 149 mo
Battery Voltage: 3.11 V
Brady Statistic AP VP Percent: 45.38 %
Brady Statistic AP VS Percent: 0 %
Brady Statistic AS VP Percent: 54.57 %
Brady Statistic AS VS Percent: 0.01 %
Brady Statistic RA Percent Paced: 38.92 %
Brady Statistic RV Percent Paced: 99.97 %
Date Time Interrogation Session: 20241211214439
Implantable Pulse Generator Implant Date: 20240311
Lead Channel Impedance Value: 304 Ohm
Lead Channel Impedance Value: 380 Ohm
Lead Channel Impedance Value: 456 Ohm
Lead Channel Impedance Value: 608 Ohm
Lead Channel Pacing Threshold Amplitude: 0.5 V
Lead Channel Pacing Threshold Amplitude: 0.875 V
Lead Channel Pacing Threshold Pulse Width: 0.4 ms
Lead Channel Pacing Threshold Pulse Width: 0.4 ms
Lead Channel Sensing Intrinsic Amplitude: 0.625 mV
Lead Channel Sensing Intrinsic Amplitude: 0.625 mV
Lead Channel Sensing Intrinsic Amplitude: 31.625 mV
Lead Channel Sensing Intrinsic Amplitude: 31.625 mV
Lead Channel Setting Pacing Amplitude: 1 V
Lead Channel Setting Pacing Amplitude: 2 V
Lead Channel Setting Pacing Pulse Width: 0.4 ms
Lead Channel Setting Sensing Sensitivity: 1.2 mV
Zone Setting Status: 755011
Zone Setting Status: 755011

## 2023-07-02 ENCOUNTER — Telehealth: Payer: Self-pay | Admitting: Cardiovascular Disease

## 2023-07-02 NOTE — Telephone Encounter (Signed)
Pt c/o medication issue:  1. Name of Medication: Repatha  2. How are you currently taking this medication (dosage and times per day)?   3. Are you having a reaction (difficulty breathing--STAT)?   4. What is your medication issue? Patient was to know if he should still take crestor and zetia, along with Repatha.

## 2023-07-03 NOTE — Telephone Encounter (Signed)
Called patient and left a detailed message with the following from Dr. Mariah Milling.  For now would stay on Repatha and Crestor hold the Zetia (do not throw it away) Thx TG

## 2023-07-31 NOTE — Progress Notes (Signed)
Remote pacemaker transmission.   

## 2023-08-20 ENCOUNTER — Other Ambulatory Visit: Payer: Self-pay | Admitting: Cardiovascular Disease

## 2023-08-20 DIAGNOSIS — I480 Paroxysmal atrial fibrillation: Secondary | ICD-10-CM

## 2023-08-21 NOTE — Telephone Encounter (Signed)
 Prescription refill request for Eliquis  received. Indication:afib Last office visit:10/24 Scr:0.74  9/24 Age: 83 Weight:73.7  kg  Prescription refilled

## 2023-08-25 ENCOUNTER — Encounter: Payer: Self-pay | Admitting: Cardiovascular Disease

## 2023-09-24 ENCOUNTER — Ambulatory Visit (INDEPENDENT_AMBULATORY_CARE_PROVIDER_SITE_OTHER): Payer: PRIVATE HEALTH INSURANCE

## 2023-09-24 DIAGNOSIS — I480 Paroxysmal atrial fibrillation: Secondary | ICD-10-CM

## 2023-09-24 LAB — CUP PACEART REMOTE DEVICE CHECK
Battery Remaining Longevity: 147 mo
Battery Voltage: 3.07 V
Brady Statistic AP VP Percent: 44.39 %
Brady Statistic AP VS Percent: 0 %
Brady Statistic AS VP Percent: 55.59 %
Brady Statistic AS VS Percent: 0.02 %
Brady Statistic RA Percent Paced: 44.23 %
Brady Statistic RV Percent Paced: 99.98 %
Date Time Interrogation Session: 20250313074554
Implantable Pulse Generator Implant Date: 20240311
Lead Channel Impedance Value: 304 Ohm
Lead Channel Impedance Value: 380 Ohm
Lead Channel Impedance Value: 456 Ohm
Lead Channel Impedance Value: 608 Ohm
Lead Channel Pacing Threshold Amplitude: 0.625 V
Lead Channel Pacing Threshold Amplitude: 1 V
Lead Channel Pacing Threshold Pulse Width: 0.4 ms
Lead Channel Pacing Threshold Pulse Width: 0.4 ms
Lead Channel Sensing Intrinsic Amplitude: 1.5 mV
Lead Channel Sensing Intrinsic Amplitude: 1.5 mV
Lead Channel Sensing Intrinsic Amplitude: 31.625 mV
Lead Channel Sensing Intrinsic Amplitude: 31.625 mV
Lead Channel Setting Pacing Amplitude: 1 V
Lead Channel Setting Pacing Amplitude: 2 V
Lead Channel Setting Pacing Pulse Width: 0.4 ms
Lead Channel Setting Sensing Sensitivity: 1.2 mV
Zone Setting Status: 755011
Zone Setting Status: 755011

## 2023-09-27 ENCOUNTER — Encounter: Payer: Self-pay | Admitting: Cardiology

## 2023-10-09 ENCOUNTER — Encounter: Payer: Self-pay | Admitting: Family Medicine

## 2023-10-09 ENCOUNTER — Other Ambulatory Visit: Payer: Self-pay | Admitting: Family Medicine

## 2023-10-09 DIAGNOSIS — I723 Aneurysm of iliac artery: Secondary | ICD-10-CM

## 2023-10-14 ENCOUNTER — Ambulatory Visit
Admission: RE | Admit: 2023-10-14 | Discharge: 2023-10-14 | Disposition: A | Discharge status: Home / Self Care | Source: Ambulatory Visit | Attending: Family Medicine | Admitting: Family Medicine

## 2023-10-14 DIAGNOSIS — I723 Aneurysm of iliac artery: Secondary | ICD-10-CM | POA: Insufficient documentation

## 2023-10-22 ENCOUNTER — Ambulatory Visit: Payer: Medicare Other | Admitting: Dermatology

## 2023-11-06 NOTE — Progress Notes (Signed)
 Remote pacemaker transmission.

## 2023-11-19 ENCOUNTER — Ambulatory Visit
Admission: EM | Admit: 2023-11-19 | Discharge: 2023-11-19 | Disposition: A | Discharge status: Home / Self Care | Attending: Emergency Medicine | Admitting: Emergency Medicine

## 2023-11-19 DIAGNOSIS — J069 Acute upper respiratory infection, unspecified: Secondary | ICD-10-CM | POA: Diagnosis not present

## 2023-11-19 MED ORDER — AZITHROMYCIN 250 MG PO TABS
250.0000 mg | ORAL_TABLET | Freq: Every day | ORAL | 0 refills | Status: DC
Start: 2023-11-19 — End: 2024-04-26

## 2023-11-19 NOTE — Discharge Instructions (Addendum)
 Take the Zithromax as directed.  Follow up with your primary care provider if your symptoms are not improving.

## 2023-11-19 NOTE — ED Provider Notes (Signed)
 Arlander Bellman    CSN: [id number] Arrival date & time: 11/19/23  [id number]      History   Chief Complaint Chief Complaint  Patient presents with   Cough   Nasal Congestion    HPI Todd Lucas is a 83 y.o. male.  Accompanied by his wife, patient presents with 1 week history of fatigue, congestion, postnasal drip, cough.  His symptoms started while he was on a [place]-day [place].  No fever or shortness of breath.  He has taken 2 COVID tests at home which were negative.  He has been treating his symptoms with Tylenol  and Mucinex.  The history is provided by the patient, the spouse and medical records.    Past Medical History:  Diagnosis Date   Acquired dilation of ascending aorta and aortic root (HCC)    a. 02/2022 Echo: Asc Ao 42mm; b. 12/2022 Cardiac CT: Asc Ao 4.7cm; c. 01/2023 Myoview : 4.8cm Asc Ao noted on CT imaging.   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 Nodular. EDC   Coronary artery calcification seen on CT scan    a. 12/2022 Cardiac CT: Ca2+ = 5137 (95th%'ile). CAC > 300 in LAD/LCX/RCA; b. 01/2023 MV: small apical septal reversible defect - likely artifact due to RV pacing. EF>65%. Cor Ca2+. Asc Ao 4.8cm.   Heart block    a. prior PPM placement-->gen change 09/2022 (MDT Azure XT DR MRI SureScan, ser # [id number]).   Heart murmur    Hx of basal cell carcinoma 05/26/2018   R upper arm. Excised 07/15/2018, margins free.   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 s/p PVI in 2014.   Squamous cell carcinoma of skin 11/27/2022   L cheek, Mohs 01/01/2023    Patient Active Problem List   Diagnosis Date Noted   Hyperlipidemia 05/01/2023   Elevated coronary artery calcium  score 03/26/2023   Atypical angina (HCC)  03/26/2023   Hx of squamous cell carcinoma of skin 05/26/2018   Hx of basal cell carcinoma 05/26/2018    Past Surgical History:  Procedure Laterality Date   aneurysm artery iliac common     COLONOSCOPY WITH PROPOFOL  N/A 09/30/2021   Procedure: COLONOSCOPY WITH PROPOFOL ;  Surgeon: Shane Darling, MD;  Location: [facility] ENDOSCOPY;  Service: Endoscopy;  Laterality: N/A;   heart ablation     INSERT / REPLACE / REMOVE PACEMAKER     LEFT HEART CATH AND CORONARY ANGIOGRAPHY Left 03/26/2023   Procedure: LEFT HEART CATH AND CORONARY ANGIOGRAPHY;  Surgeon: Arleen Lacer, MD;  Location: [facility] INVASIVE CV LAB;  Service: Cardiovascular;  Laterality: Left;   PPM GENERATOR CHANGEOUT N/A 09/22/2022   Procedure: PPM GENERATOR CHANGEOUT;  Surgeon: Boyce Byes, MD;  Location: [facility] INVASIVE CV LAB;  Service: Cardiovascular;  Laterality: N/A;       Home Medications    Prior to Admission medications   Medication Sig Start Date End Date Taking? Authorizing Provider  azithromycin (ZITHROMAX) 250 MG tablet Take 1 tablet (250 mg total) by mouth daily. Take first 2 tablets together, then 1 every day until finished. 11/19/23  Yes Wellington Half, NP  acetaminophen  (TYLENOL ) 500 MG tablet Take 1,000 mg by mouth every 8 (  eight) hours as needed for moderate pain (pain score 4-6). Patient not taking: Reported on 05/01/2023 12/03/21   [provider]  amoxicillin (AMOXIL) 500 MG tablet Take by mouth. Take 4 tablets an hour before prior to dental procedures Patient not taking: Reported on 11/19/2023    [provider]  cyanocobalamin (VITAMIN B12) 1000 MCG tablet Take 1,000 mcg by mouth daily with supper.    [provider]  diltiazem  (CARDIZEM ) 30 MG tablet Take 1 tablet (30 mg total) by mouth 2 (two) times daily as needed (afib). 05/04/23   Gollan, Timothy J, MD  ELIQUIS  5 MG TABS tablet TAKE 1 TABLET TWICE A DAY 08/21/23   Gollan, Timothy J, MD  Evolocumab  (REPATHA  SURECLICK) 140 MG/ML SOAJ  Inject 140 mg into the skin every 14 (fourteen) days. 06/05/23   Gollan, Timothy J, MD  ezetimibe  (ZETIA ) 10 MG tablet Take 1 tablet (10 mg total) by mouth daily. 06/10/23 09/08/23  Gollan, Timothy J, MD  levothyroxine (SYNTHROID) 88 MCG tablet Take 88 mcg by mouth every morning. 11/27/21   [provider]  metoprolol  succinate (TOPROL -XL) 25 MG 24 hr tablet Take 12.5 mg by mouth daily.    [provider]  Polyethyl Glycol-Propyl Glycol (SYSTANE ULTRA) 0.4-0.3 % SOLN Place 1 drop into both eyes daily as needed (dry eyes).    [provider]  rosuvastatin  (CRESTOR ) 20 MG tablet Take 1 tablet (20 mg total) by mouth daily. 02/23/23   Florette Hurry, NP  zolpidem (AMBIEN) 10 MG tablet Take 10 mg by mouth at bedtime as needed for sleep.    [provider]    Family History Family History  Problem Relation Age of Onset   Angina Mother    Heart disease Father    Heart attack Father 69   Heart attack Brother 56       CABG at age 19   Heart disease Brother    Anuerysm Brother        Brain   Heart attack Maternal Grandfather 48    Social History Social History   Tobacco Use   Smoking status: Former    Current packs/day: 0.00    Average packs/day: 1 pack/day for 30.0 years (30.0 ttl pk-yrs)    Types: Cigarettes    Start date: 08/15/1944    Quit date: 08/15/1974    Years since quitting: 49.2   Smokeless tobacco: Never  Vaping Use   Vaping status: Never Used  Substance Use Topics   Alcohol use: Yes    Alcohol/week: 2.0 standard drinks of alcohol    Types: 2 Glasses of wine per week    Comment: nightly   Drug use: Never     Allergies   Patient has no known allergies.   Review of Systems Review of Systems  Constitutional:  Positive for fatigue. Negative for chills and fever.  HENT:  Positive for congestion, postnasal drip and rhinorrhea. Negative for ear pain and sore throat.   Respiratory:  Positive for cough. Negative for shortness of  breath.      Physical Exam Triage Vital Signs ED Triage Vitals  Encounter Vitals Group     BP 11/19/23 0929 116/78     Systolic BP Percentile --      Diastolic BP Percentile --      Pulse Rate 11/19/23 0929 66     Resp 11/19/23 0929 18     Temp 11/19/23 0929 98.7 F (37.1 C)     Temp src --  SpO2 11/19/23 0929 96 %     Weight --      Height --      Head Circumference --      Peak Flow --      Pain Score 11/19/23 0925 0     Pain Loc --      Pain Education --      Exclude from Growth Chart --    No data found.  Updated Vital Signs BP 116/78   Pulse 66   Temp 98.7 F (37.1 C)   Resp 18   SpO2 96%   Visual Acuity Right Eye Distance:   Left Eye Distance:   Bilateral Distance:    Right Eye Near:   Left Eye Near:    Bilateral Near:     Physical Exam Constitutional:      General: He is not in acute distress. HENT:     Right Ear: Tympanic membrane normal.     Left Ear: Tympanic membrane normal.     Nose: Rhinorrhea present.     Mouth/Throat:     Mouth: Mucous membranes are moist.     Pharynx: Oropharynx is clear.  Cardiovascular:     Rate and Rhythm: Normal rate and regular rhythm.     Heart sounds: Normal heart sounds.  Pulmonary:     Effort: Pulmonary effort is normal. No respiratory distress.     Breath sounds: Normal breath sounds.  Neurological:     Mental Status: He is alert.      UC Treatments / Results  Labs (all labs ordered are listed, but only abnormal results are displayed) Labs Reviewed - No data to display  EKG   Radiology No results found.  Procedures Procedures (including critical care time)  Medications Ordered in UC Medications - No data to display  Initial Impression / Assessment and Plan / UC Course  I have reviewed the triage vital signs and the nursing notes.  Pertinent labs & imaging results that were available during my care of the patient were reviewed by me and considered in my medical decision making (see  chart for details).    Acute upper respiratory infection.  Afebrile and vital signs are stable.  Treating today with Zithromax.  Instructed patient to continue symptomatic management with Tylenol  and Mucinex.  Instructed him to follow-up with his PCP if he is not improving.  He agrees to plan of care.  Final Clinical Impressions(s) / UC Diagnoses   Final diagnoses:  Acute upper respiratory infection     Discharge Instructions      Take the Zithromax as directed.  Follow-up with your primary care provider if your symptoms are not improving.    ED Prescriptions     Medication Sig Dispense Auth. Provider   azithromycin (ZITHROMAX) 250 MG tablet Take 1 tablet (250 mg total) by mouth daily. Take first 2 tablets together, then 1 every day until finished. 6 tablet Wellington Half, NP      PDMP not reviewed this encounter.   Wellington Half, NP 11/19/23 1000

## 2023-11-19 NOTE — ED Triage Notes (Signed)
 Patient to Urgent Care with complaints of cough/ nasal congestion/ fatigue/ post-nasal drip. No fevers.   Reports symptoms started over one week ago. Reports he is prone to bronchitis. Two negative Covid tests at home.   Meds: tylenol  and Mucinex.

## 2023-12-24 ENCOUNTER — Ambulatory Visit (INDEPENDENT_AMBULATORY_CARE_PROVIDER_SITE_OTHER): Payer: PRIVATE HEALTH INSURANCE

## 2023-12-24 DIAGNOSIS — I480 Paroxysmal atrial fibrillation: Secondary | ICD-10-CM | POA: Diagnosis not present

## 2023-12-25 ENCOUNTER — Ambulatory Visit: Payer: Self-pay | Admitting: Cardiology

## 2023-12-25 LAB — CUP PACEART REMOTE DEVICE CHECK
Battery Remaining Longevity: 144 mo
Battery Voltage: 3.05 V
Brady Statistic AP VP Percent: 42.37 %
Brady Statistic AP VS Percent: 0 %
Brady Statistic AS VP Percent: 57.62 %
Brady Statistic AS VS Percent: 0.01 %
Brady Statistic RA Percent Paced: 42.23 %
Brady Statistic RV Percent Paced: 99.99 %
Date Time Interrogation Session: 20250612072702
Implantable Pulse Generator Implant Date: 20240311
Lead Channel Impedance Value: 323 Ohm
Lead Channel Impedance Value: 399 Ohm
Lead Channel Impedance Value: 456 Ohm
Lead Channel Impedance Value: 608 Ohm
Lead Channel Pacing Threshold Amplitude: 0.5 V
Lead Channel Pacing Threshold Amplitude: 1.125 V
Lead Channel Pacing Threshold Pulse Width: 0.4 ms
Lead Channel Pacing Threshold Pulse Width: 0.4 ms
Lead Channel Sensing Intrinsic Amplitude: 0.375 mV
Lead Channel Sensing Intrinsic Amplitude: 0.375 mV
Lead Channel Sensing Intrinsic Amplitude: 31.625 mV
Lead Channel Sensing Intrinsic Amplitude: 31.625 mV
Lead Channel Setting Pacing Amplitude: 1 V
Lead Channel Setting Pacing Amplitude: 2 V
Lead Channel Setting Pacing Pulse Width: 0.4 ms
Lead Channel Setting Sensing Sensitivity: 1.2 mV
Zone Setting Status: 755011
Zone Setting Status: 755011

## 2024-02-03 ENCOUNTER — Ambulatory Visit: Attending: Cardiology | Admitting: Cardiology

## 2024-02-03 ENCOUNTER — Encounter: Payer: Self-pay | Admitting: Cardiology

## 2024-02-03 VITALS — BP 100/64 | HR 56 | Ht 73.0 in | Wt 162.0 lb

## 2024-02-03 DIAGNOSIS — I480 Paroxysmal atrial fibrillation: Secondary | ICD-10-CM | POA: Insufficient documentation

## 2024-02-03 DIAGNOSIS — Z950 Presence of cardiac pacemaker: Secondary | ICD-10-CM | POA: Insufficient documentation

## 2024-02-03 LAB — CUP PACEART INCLINIC DEVICE CHECK
Date Time Interrogation Session: 20250723163022
Implantable Pulse Generator Implant Date: 20240311
Lead Channel Pacing Threshold Amplitude: 1 V
Lead Channel Pacing Threshold Amplitude: 1 V
Lead Channel Pacing Threshold Pulse Width: 0.4 ms
Lead Channel Pacing Threshold Pulse Width: 0.4 ms

## 2024-02-03 NOTE — Patient Instructions (Signed)

## 2024-02-03 NOTE — Progress Notes (Signed)
 [facility] Office Follow up Visit Note:    Date:  02/03/2024   ID:  Todd Lucas, DOB [birth year], MRN [id number]  PCP:  Valora Agent, MD  [facility] Cardiologist:  Evalene Lunger, MD  [facility] Electrophysiologist:  OLE ONEIDA HOLTS, MD    Interval History:     Todd Lucas is a 83 y.o. male who presents for a follow up visit.   I last saw the patient January 08, 2023. He has a history of paroxysmal atrial fibrillation on Eliquis .  Also with a permanent pacemaker.  At the last appointment he was doing well.  He last saw Dr. Lunger May 01, 2023.  At that appointment, he was maintaining sinus rhythm.  He takes metoprolol .  He is with his wife today who I have previously met.  He is doing well.  He has follow-up coming up soon with a cardiothoracic surgeon to discuss his aneurysm.  His daughter had to have her ascending aorta/aortic valve replaced before the age of 48.  Bicuspid aortic valve.  He is overall feeling okay.  No problems with his pacemaker.      Past medical, surgical, social and family history were reviewed.  ROS:   Please see the history of present illness.    All other systems reviewed and are negative.  EKGs/Labs/Other Studies Reviewed:    The following studies were reviewed today:  February 03, 2024 in-clinic device interrogation personally reviewed Battery and lead parameter stable. No programming changes made today        Physical Exam:    VS:  BP 100/64 (BP Location: Left Arm, Patient Position: Sitting, Cuff Size: Normal)   Pulse (!) 56   Ht 6' 1 (1.854 m)   Wt 162 lb (73.5 kg)   SpO2 97%   BMI 21.37 kg/m     Wt Readings from Last 3 Encounters:  02/03/24 162 lb (73.5 kg)  05/01/23 162 lb 6 oz (73.7 kg)  03/26/23 165 lb (74.8 kg)     GEN: no distress CARD: RRR, No MRG.  Prepectoral pocket well-healed RESP: No IWOB. CTAB.      ASSESSMENT:    1. Cardiac pacemaker in situ   2. Paroxysmal atrial  fibrillation (HCC)    PLAN:    In order of problems listed above:  #Symptomatic bradycardia #Permanent pacemaker in situ Device functioning appropriately.  Continue remote monitoring.  #Atrial fibrillation Maintaining sinus rhythm Continue Eliquis  for stroke prophylaxis  Follow-up 1 year with EP APP   Signed, OLE HOLTS, MD, [facility], [facility] 02/03/2024 2:31 PM    Electrophysiology [facility]

## 2024-02-04 ENCOUNTER — Ambulatory Visit: Payer: Self-pay | Admitting: Cardiology

## 2024-02-12 ENCOUNTER — Encounter

## 2024-02-17 NOTE — Addendum Note (Signed)
 Addended by: VICCI SELLER A on: 02/17/2024 08:55 AM   Modules accepted: Orders

## 2024-02-17 NOTE — Progress Notes (Signed)
 Remote pacemaker transmission.

## 2024-02-19 NOTE — Progress Notes (Signed)
 Cardiology Office Note  Date:  02/22/2024   ID:  Todd Lucas, DOB 08-01-1940, MRN [id number]  PCP:  Valora Agent, MD   Chief Complaint  Patient presents with   Follow-up    Patient present today for Atypical angina, and Paroxysmal atrial fibrillation.    HPI:  Todd Lucas is a 83 y.o. 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  Cath 2014: Mild nonobstructive disease Repeat catheterization September 2024, heavily calcified with nonobstructive disease Chest CTA at [facility] detailing aorta 5.1 cm at sinus of Valsalva, 4.7 cm at maximum size of ascending aorta Who presents for follow-up of his atrial fibrillation, aortic valve disease, dilated ascending aorta   Last seen by myself in clinic October 2024 He is with his wife today in clinic.  Recent travel on cruise ship, Plays pickle ball Denies significant shortness of breath or chest pain concerning for angina  Pacer downloads reviewed, minimal atrial fibrillation recorded  Tolerating Crestor  10 daily with Repatha , total cholesterol 98 Blood pressure well-controlled at home typically around 110 systolic No significant PND orthopnea leg swelling Overall feels things are good  Has follow-up with vascular surgery for CT scan aorta  Prior aorta measuring 5.1 cm at the sinus of Valsalva  Low risk stress test July 2024  grossly homogeneous tracer uptake, apical region with perfusion defect  Cardiac cath 03/26/23 Angiographically heavily calcified vessels with nonobstructive disease  EKG personally reviewed by myself on todays visit EKG Interpretation Date/Time:  Monday February 22 2024 14:25:28  EDT Ventricular Rate:  51 PR Interval:    QRS Duration:  188 QT Interval:  506 QTC Calculation: 466 R Axis:   -88  Text Interpretation: AV dual-paced rhythm When compared with ECG of 01-May-2023 14:20, Vent. 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  PSH:    Past Surgical History:  Procedure Laterality Date   aneurysm artery iliac common     COLONOSCOPY WITH PROPOFOL  N/A 09/30/2021   Procedure: COLONOSCOPY WITH PROPOFOL ;  Surgeon: Maryruth Ole DASEN, MD;  Location: [facility] ENDOSCOPY;  Service: Endoscopy;  Laterality: N/A;   heart ablation     INSERT / REPLACE / REMOVE PACEMAKER     LEFT HEART CATH AND CORONARY ANGIOGRAPHY Left 03/26/2023   Procedure: LEFT HEART CATH AND CORONARY ANGIOGRAPHY;  Surgeon: Anner Alm ORN, MD;  Location: [facility] INVASIVE CV LAB;  Service: Cardiovascular;  Laterality: Left;   PPM GENERATOR CHANGEOUT N/A 09/22/2022   Procedure: PPM GENERATOR CHANGEOUT;  Surgeon: Cindie Ole DASEN, MD;  Location: [facility] INVASIVE CV LAB;  Service: Cardiovascular;  Laterality: N/A;    Current Outpatient Medications  Medication Sig Dispense Refill   acetaminophen  (TYLENOL ) 500 MG tablet Take 1,000 mg by mouth every 8 (eight) hours as needed for moderate pain (pain score  4-6).     amoxicillin (AMOXIL) 500 MG tablet Take by mouth. Take 4 tablets an hour before prior to dental procedures (Patient taking differently: Take by mouth as needed. Take 4 tablets an hour before  prior to dental procedures)     cyclobenzaprine (FLEXERIL) 5 MG tablet Take 5 mg by mouth 2 (two) times daily. (Patient taking differently: Take 5 mg by mouth as needed.)     diltiazem  (CARDIZEM ) 30 MG tablet Take 1 tablet (30 mg total) by mouth 2 (two) times daily as needed (afib). 30 tablet 1   ELIQUIS  5 MG TABS tablet TAKE 1 TABLET TWICE A DAY 180 tablet 3   levothyroxine (SYNTHROID) 88 MCG tablet Take 88 mcg by mouth every morning.     methocarbamol (ROBAXIN) 500 MG tablet Take 500-1,000 mg by mouth every 6 (six) hours as needed.     Polyethyl Glycol-Propyl Glycol (SYSTANE ULTRA) 0.4-0.3 % SOLN Place 1 drop into both eyes daily as needed (dry eyes).     zolpidem (AMBIEN) 10 MG tablet Take 10 mg by mouth at bedtime as needed for sleep.     azithromycin  (ZITHROMAX ) 250 MG tablet Take 1 tablet (250 mg total) by mouth daily. Take first 2 tablets together, then 1 every day until finished. (Patient not taking: Reported on 02/22/2024) 6 tablet 0   cyanocobalamin (VITAMIN B12) 1000 MCG tablet Take 1,000 mcg by mouth daily with supper. (Patient not taking: Reported on 02/22/2024)     Evolocumab  (REPATHA  SURECLICK) 140 MG/ML SOAJ Inject 140 mg into the skin every 14 (fourteen) days. 6 mL 3   metoprolol  succinate (TOPROL -XL) 25 MG 24 hr tablet Take 0.5 tablets (12.5 mg total) by mouth daily. 45 tablet 3   rosuvastatin  (CRESTOR ) 10 MG tablet Take 1 tablet (10 mg total) by mouth daily. 90 tablet 3   No current facility-administered medications for this visit.     Allergies:   Other   Social History:  The patient  reports that he quit smoking about 49 years ago. His smoking use included cigarettes. He started smoking about 79 years ago. He has a 30 pack-year smoking history. He has never used smokeless tobacco. He reports current alcohol use of about 2.0 standard drinks of alcohol per week. He reports that he does not use drugs.   Family History:   family history includes Angina in his mother; Anuerysm in  his brother; Heart attack (age of onset: 79) in his brother; Heart attack (age of onset: 4) in his father; Heart attack (age of onset: 68) in his maternal grandfather; Heart disease in his brother and father.    Review of Systems: Review of Systems  Constitutional: Negative.   HENT: Negative.    Respiratory: Negative.    Cardiovascular: Negative.   Gastrointestinal: Negative.   Musculoskeletal: Negative.   Neurological: Negative.   Psychiatric/Behavioral: Negative.    All other systems reviewed and are negative.   PHYSICAL EXAM: VS:  BP 124/80 (BP Location: Left Arm, Patient Position: Sitting, Cuff Size: Normal)   Pulse (!) 51   Ht 6' 1 (1.854 m)   Wt 162 lb 3.2 oz (73.6 kg)   SpO2 99%   BMI 21.40 kg/m  , BMI Body mass index is 21.4 kg/m. Constitutional:  oriented to person, place, and time. No distress.  HENT:  Head: Grossly normal Eyes:  no discharge. No scleral icterus.  Neck: No JVD, no carotid bruits  Cardiovascular: Regular rate and rhythm, no murmurs appreciated Pulmonary/Chest: Clear to auscultation  bilaterally, no wheezes or rales Abdominal: Soft.  no distension.  no tenderness.  Musculoskeletal: Normal range of motion Neurological:  normal muscle tone. Coordination normal. No atrophy Skin: Skin warm and dry Psychiatric: normal affect, pleasant  Recent Labs: 02/23/2023: Magnesium 2.4 03/23/2023: BUN 12; Creatinine, Ser 0.74; Hemoglobin 14.0; Platelets 144; Potassium 4.2; Sodium 138 05/29/2023: ALT 39    Lipid Panel Lab Results  Component Value Date   CHOL 148 05/15/2023   HDL 55 05/15/2023   LDLCALC 74 05/15/2023   TRIG 101 05/15/2023    Wt Readings from Last 3 Encounters:  02/22/24 162 lb 3.2 oz (73.6 kg)  02/03/24 162 lb (73.5 kg)  05/01/23 162 lb 6 oz (73.7 kg)     ASSESSMENT AND PLAN:  Problem List Items Addressed This Visit       Cardiology Problems   Hyperlipidemia   Relevant Medications   rosuvastatin  (CRESTOR ) 10 MG tablet    Evolocumab  (REPATHA  SURECLICK) 140 MG/ML SOAJ   metoprolol  succinate (TOPROL -XL) 25 MG 24 hr tablet   Elevated coronary artery calcium  score   Relevant Medications   rosuvastatin  (CRESTOR ) 10 MG tablet   Evolocumab  (REPATHA  SURECLICK) 140 MG/ML SOAJ   metoprolol  succinate (TOPROL -XL) 25 MG 24 hr tablet   Atypical angina (HCC)   Relevant Medications   rosuvastatin  (CRESTOR ) 10 MG tablet   Evolocumab  (REPATHA  SURECLICK) 140 MG/ML SOAJ   metoprolol  succinate (TOPROL -XL) 25 MG 24 hr tablet   Other Visit Diagnoses       Paroxysmal atrial fibrillation (HCC)    -  Primary   Relevant Medications   rosuvastatin  (CRESTOR ) 10 MG tablet   Evolocumab  (REPATHA  SURECLICK) 140 MG/ML SOAJ   metoprolol  succinate (TOPROL -XL) 25 MG 24 hr tablet   Other Relevant Orders   EKG 12-Lead (Completed)     Cardiac pacemaker in situ       Relevant Orders   EKG 12-Lead (Completed)     Ascending aorta dilation (HCC)       Relevant Medications   rosuvastatin  (CRESTOR ) 10 MG tablet   Evolocumab  (REPATHA  SURECLICK) 140 MG/ML SOAJ   metoprolol  succinate (TOPROL -XL) 25 MG 24 hr tablet     NSVT (nonsustained ventricular tachycardia) (HCC)       Relevant Medications   rosuvastatin  (CRESTOR ) 10 MG tablet   Evolocumab  (REPATHA  SURECLICK) 140 MG/ML SOAJ   metoprolol  succinate (TOPROL -XL) 25 MG 24 hr tablet        Coronary artery disease with stable angina Calcium  score 5000 Low risk Myoview  Cardiac catheterization performed September 2024 with heavy three-vessel coronary calcification, nonobstructive disease, no intervention performed -Continue Repatha  and Crestor  10 -Reports he is off Zetia  - Denies chest pain concerning for angina, active, plays pickle ball  Paroxysmal atrial fibrillation Prior ablation, followed by EP, maintaining normal sinus rhythm,  Continue metoprolol  succinate 12.5 daily On anticoagulation - Pacer downloads showing minimal atrial fibrillation  Dilated ascending aorta and root In  the setting of bicuspid aortic valve Followed by Dr. V[place]a at [facility] Scheduled for CT scan in the next several weeks  Hyperlipidemia Continue Repatha  with Crestor  10 Reports he is off Zetia    Signed, Velinda Lunger, M.D., Ph.D. [facility] [place], [place] [phone]

## 2024-02-22 ENCOUNTER — Ambulatory Visit: Attending: Cardiovascular Disease | Admitting: Cardiovascular Disease

## 2024-02-22 ENCOUNTER — Encounter: Payer: Self-pay | Admitting: Cardiovascular Disease

## 2024-02-22 VITALS — BP 124/80 | HR 51 | Ht 73.0 in | Wt 162.2 lb

## 2024-02-22 DIAGNOSIS — R0989 Other specified symptoms and signs involving the circulatory and respiratory systems: Secondary | ICD-10-CM | POA: Insufficient documentation

## 2024-02-22 DIAGNOSIS — I77810 Thoracic aortic ectasia: Secondary | ICD-10-CM | POA: Diagnosis not present

## 2024-02-22 DIAGNOSIS — Z950 Presence of cardiac pacemaker: Secondary | ICD-10-CM | POA: Diagnosis not present

## 2024-02-22 DIAGNOSIS — E785 Hyperlipidemia, unspecified: Secondary | ICD-10-CM | POA: Insufficient documentation

## 2024-02-22 DIAGNOSIS — I4729 Other ventricular tachycardia: Secondary | ICD-10-CM | POA: Diagnosis not present

## 2024-02-22 DIAGNOSIS — I2089 Other forms of angina pectoris: Secondary | ICD-10-CM | POA: Insufficient documentation

## 2024-02-22 DIAGNOSIS — R931 Abnormal findings on diagnostic imaging of heart and coronary circulation: Secondary | ICD-10-CM | POA: Diagnosis present

## 2024-02-22 DIAGNOSIS — I480 Paroxysmal atrial fibrillation: Secondary | ICD-10-CM | POA: Insufficient documentation

## 2024-02-22 MED ORDER — ROSUVASTATIN CALCIUM 10 MG PO TABS: 10.0000 mg | ORAL_TABLET | Freq: Every day | ORAL | 3 refills | Status: AC

## 2024-02-22 MED ORDER — REPATHA SURECLICK 140 MG/ML ~~LOC~~ SOAJ: 140.0000 mg | SUBCUTANEOUS | 3 refills | Status: AC

## 2024-02-22 MED ORDER — METOPROLOL SUCCINATE ER 25 MG PO TB24: 12.5000 mg | ORAL_TABLET | Freq: Every day | ORAL | 3 refills | Status: DC

## 2024-02-22 NOTE — Patient Instructions (Addendum)
 Medication Instructions:  No changes  If you need a refill on your cardiac medications before your next appointment, please call your pharmacy.   Lab work: No new labs needed  Testing/Procedures:  Your physician has requested that you have a carotid duplex. This test is an ultrasound of the carotid arteries in your neck. It looks at blood flow through these arteries that supply the brain with blood.   Allow one hour for this exam.  There are no restrictions or special instructions.  This will take place at [address] [facility]) [address], [place] [address]  Please note: We ask at that you not bring children with you during ultrasound (echo/ vascular) testing. Due to room size and safety concerns, children are not allowed in the ultrasound rooms during exams. Our front office staff cannot provide observation of children in our lobby area while testing is being conducted. An adult accompanying a patient to their appointment will only be allowed in the ultrasound room at the discretion of the ultrasound technician under special circumstances. We apologize for any inconvenience.   Follow-Up: At [facility], you and your health needs are our priority.  As part of our continuing mission to provide you with exceptional heart care, we have created designated Provider Care Teams.  These Care Teams include your primary Cardiologist (physician) and Advanced Practice Providers (APPs -  Physician Assistants and Nurse Practitioners) who all work together to provide you with the care you need, when you need it.  You will need a follow up appointment in 12 months  Providers on your designated Care Team:   Lonni Meager, NP Bernardino Bring, PA-C Cadence [place], [place]  COVID-19 Vaccine Information can be found at: [email or web address] For questions related to vaccine distribution or appointments, please email [email or web address] or  call [phone].

## 2024-03-15 ENCOUNTER — Ambulatory Visit: Attending: Cardiovascular Disease

## 2024-03-15 DIAGNOSIS — R0989 Other specified symptoms and signs involving the circulatory and respiratory systems: Secondary | ICD-10-CM | POA: Insufficient documentation

## 2024-03-19 ENCOUNTER — Ambulatory Visit: Payer: Self-pay | Admitting: Cardiovascular Disease

## 2024-03-24 ENCOUNTER — Ambulatory Visit (INDEPENDENT_AMBULATORY_CARE_PROVIDER_SITE_OTHER): Payer: Self-pay

## 2024-03-24 DIAGNOSIS — I480 Paroxysmal atrial fibrillation: Secondary | ICD-10-CM

## 2024-03-24 LAB — CUP PACEART REMOTE DEVICE CHECK
Battery Remaining Longevity: 141 mo
Battery Voltage: 3.04 V
Brady Statistic AP VP Percent: 47.39 %
Brady Statistic AP VS Percent: 0 %
Brady Statistic AS VP Percent: 52.6 %
Brady Statistic AS VS Percent: 0.01 %
Brady Statistic RA Percent Paced: 47.24 %
Brady Statistic RV Percent Paced: 99.99 %
Date Time Interrogation Session: 20250910212426
Implantable Pulse Generator Implant Date: 20240311
Lead Channel Impedance Value: 304 Ohm
Lead Channel Impedance Value: 380 Ohm
Lead Channel Impedance Value: 456 Ohm
Lead Channel Impedance Value: 627 Ohm
Lead Channel Pacing Threshold Amplitude: 0.5 V
Lead Channel Pacing Threshold Amplitude: 1 V
Lead Channel Pacing Threshold Pulse Width: 0.4 ms
Lead Channel Pacing Threshold Pulse Width: 0.4 ms
Lead Channel Sensing Intrinsic Amplitude: 1.875 mV
Lead Channel Sensing Intrinsic Amplitude: 1.875 mV
Lead Channel Sensing Intrinsic Amplitude: 31.625 mV
Lead Channel Sensing Intrinsic Amplitude: 31.625 mV
Lead Channel Setting Pacing Amplitude: 1 V
Lead Channel Setting Pacing Amplitude: 2 V
Lead Channel Setting Pacing Pulse Width: 0.4 ms
Lead Channel Setting Sensing Sensitivity: 1.2 mV
Zone Setting Status: 755011
Zone Setting Status: 755011

## 2024-03-27 ENCOUNTER — Ambulatory Visit: Payer: Self-pay | Admitting: Cardiology

## 2024-04-26 ENCOUNTER — Telehealth: Payer: Self-pay

## 2024-04-26 ENCOUNTER — Ambulatory Visit (HOSPITAL_COMMUNITY)
Admission: RE | Admit: 2024-04-26 | Discharge: 2024-04-26 | Disposition: A | Discharge status: Home / Self Care | Source: Ambulatory Visit | Attending: Physician Assistant | Admitting: Physician Assistant

## 2024-04-26 ENCOUNTER — Other Ambulatory Visit (HOSPITAL_COMMUNITY): Payer: Self-pay | Admitting: Physician Assistant

## 2024-04-26 VITALS — BP 108/64 | HR 52 | Ht 73.0 in | Wt 165.8 lb

## 2024-04-26 DIAGNOSIS — I4891 Unspecified atrial fibrillation: Secondary | ICD-10-CM | POA: Insufficient documentation

## 2024-04-26 DIAGNOSIS — D6869 Other thrombophilia: Secondary | ICD-10-CM | POA: Insufficient documentation

## 2024-04-26 DIAGNOSIS — I480 Paroxysmal atrial fibrillation: Secondary | ICD-10-CM | POA: Diagnosis present

## 2024-04-26 NOTE — Telephone Encounter (Addendum)
 Alert received:  Alert remote transmission:  AT/AF Daily Burden > Threshold AF in progress from 10/13, controlled rate, Eliquis  per EPIC   Outreach made to Pt.  Left detailed message requesting call back to assess for symptoms.

## 2024-04-26 NOTE — Progress Notes (Signed)
 Primary Care Physician: Stanton Lynwood FALCON, MD Primary Cardiologist: Timothy Gollan, MD Electrophysiologist: OLE ONEIDA HOLTS, MD  Referring Physician: Device Clinic    Todd Lucas is a 83 y.o. male with a history of bicuspid AV, dilated aortic root, HTN, heart block s/p PPM, atrial fibrillation who presents for follow up in the [facility].  The patient was initially diagnosed with atrial fibrillation remotely and historically had a low burden. Patient is on Eliquis  for stroke prevention. The device clinic received an alert earlier today for an ongoing afib episode starting 04/25/24.    Patient presents today for follow up for atrial fibrillation. He remains in afib with symptoms of fatigue on exertion. There were no specific triggers that he could identify. He is currently holding his metoprolol  due to low BP readings. No bleeding issues on anticoagulation.   Today, he denies symptoms of palpitations, chest pain, orthopnea, PND, lower extremity edema, dizziness, presyncope, syncope, snoring, daytime somnolence, bleeding, or neurologic sequela. The patient is tolerating medications without difficulties and is otherwise without complaint today.    Atrial Fibrillation Risk Factors:  he does not have symptoms or diagnosis of sleep apnea. he does not have a history of rheumatic fever.   Atrial Fibrillation Management history:  Previous antiarrhythmic drugs: none Previous cardioversions: none Previous ablations: 2014 cryoablation Anticoagulation history: Eliquis   ROS- All systems are reviewed and negative except as per the HPI above.  Past Medical History:  Diagnosis Date   Acquired dilation of ascending aorta and aortic root    a. 02/2022 Echo: Asc Ao 42mm; b. 12/2022 Cardiac CT: Asc Ao 4.7cm; c. 01/2023 Myoview : 4.8cm Asc Ao noted on CT imaging.   Actinic keratosis 09/17/2022   Hypertrophic, Mid ant vertex, needs LN2   Basal cell carcinoma 08/04/2018    right upper arm    Basal cell carcinoma 09/17/2022   Right posterior neck. Nodular. EDC   Coronary artery calcification seen on CT scan    a. 12/2022 Cardiac CT: Ca2+ = 5137 (95th%'ile). CAC > 300 in LAD/LCX/RCA; b. 01/2023 MV: small apical septal reversible defect - likely artifact due to RV pacing. EF>65%. Cor Ca2+. Asc Ao 4.8cm.   Heart block    a. prior PPM placement-->gen change 09/2022 (MDT Azure XT DR MRI SureScan, ser # [id number]).   Heart murmur    Hx of basal cell carcinoma 05/26/2018   R upper arm. Excised 07/15/2018, margins free.   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 s/p PVI in 2014.   Squamous cell carcinoma of skin 11/27/2022   L cheek, Mohs 01/01/2023    Current Outpatient Medications  Medication Sig Dispense Refill   acetaminophen  (TYLENOL ) 500 MG tablet Take 1,000 mg by mouth every 8 (eight) hours as needed for moderate pain (pain score 4-6). (Patient taking differently: Take 1,000 mg by mouth as needed for moderate pain (pain score 4-6).)     amoxicillin (AMOXIL) 500 MG tablet Take by mouth. Take 4 tablets an hour before prior to dental procedures     cyclobenzaprine (FLEXERIL) 5 MG tablet Take 5 mg by mouth 2 (two) times daily. (Patient taking differently: Take 5 mg by mouth as needed.)     diltiazem  (CARDIZEM ) 30 MG tablet Take 1 tablet (30 mg total) by mouth 2 (two) times daily as needed (afib). 30 tablet 1  ELIQUIS  5 MG TABS tablet TAKE 1 TABLET TWICE A DAY 180 tablet 3   Eszopiclone 3 MG TABS Take 3 mg by mouth as needed.     Evolocumab  (REPATHA  SURECLICK) 140 MG/ML SOAJ Inject 140 mg into the skin every 14 (fourteen) days. 6 mL 3   levothyroxine (SYNTHROID) 88 MCG tablet Take 88 mcg by mouth every morning.     methocarbamol (ROBAXIN) 500 MG tablet Take 500-1,000 mg by mouth every 6 (six) hours as needed. (Patient taking differently: Take 500-1,000 mg  by mouth as needed.)     metoprolol  succinate (TOPROL -XL) 25 MG 24 hr tablet Take 0.5 tablets (12.5 mg total) by mouth daily. 45 tablet 3   Polyethyl Glycol-Propyl Glycol (SYSTANE ULTRA) 0.4-0.3 % SOLN Place 1 drop into both eyes daily as needed (dry eyes).     rosuvastatin  (CRESTOR ) 10 MG tablet Take 1 tablet (10 mg total) by mouth daily. 90 tablet 3   zolpidem (AMBIEN) 10 MG tablet Take 10 mg by mouth at bedtime as needed for sleep.     No current facility-administered medications for this encounter.    Physical Exam: BP 108/64   Pulse (!) 52   Ht 6' 1 (1.854 m)   Wt 75.2 kg   BMI 21.87 kg/m   GEN: Well nourished, well developed in no acute distress CARDIAC: Regular rate and rhythm, no murmurs, rubs, gallops RESPIRATORY:  Clear to auscultation without rales, wheezing or rhonchi  ABDOMEN: Soft, non-tender, non-distended EXTREMITIES:  No edema; No deformity   Wt Readings from Last 3 Encounters:  04/26/24 75.2 kg  02/22/24 73.6 kg  02/03/24 73.5 kg     EKG today demonstrates  V pacing with underlying afib Vent. rate 52 BPM PR interval * ms QRS duration 184 ms QT/QTcB 496/461 ms   Echo 02/25/22 demonstrated   1. Left ventricular ejection fraction, by estimation, is 60 to 65%. The  left ventricle has normal function. The left ventricle has no regional  wall motion abnormalities. There is mild left ventricular hypertrophy.  Left ventricular diastolic parameters are consistent with Grade II diastolic dysfunction (pseudonormalization).   2. Right ventricular systolic function is normal. The right ventricular  size is moderately enlarged. Tricuspid regurgitation signal is inadequate  for assessing PA pressure.   3. Left atrial size was mildly dilated.   4. The mitral valve is normal in structure. Mild mitral valve  regurgitation. No evidence of mitral stenosis.   5. The aortic valve is bicuspid. There is mild calcification of the  aortic valve. Aortic valve regurgitation is  not visualized. Aortic valve  sclerosis is present, with no evidence of aortic valve stenosis.   6. There is mild dilatation of the ascending aorta, measuring 42 mm.   7. The inferior vena cava is normal in size with greater than 50%  respiratory variability, suggesting right atrial pressure of 3 mmHg.    CHA2DS2-VASc Score = 3  The patient's score is based upon: CHF History: 0 HTN History: 1 Diabetes History: 0 Stroke History: 0 Vascular Disease History: 0 Age Score: 2 Gender Score: 0       ASSESSMENT AND PLAN: Paroxysmal Atrial Fibrillation (ICD10:  I48.0) The patient's CHA2DS2-VASc score is 3, indicating a 3.2% annual risk of stroke.   Patient in afib today, rate controlled.  We discussed rhythm control options today. Will arrange for DCCV and have him send a manual device transmission the day prior to confirm he is still in afib. Long term, if he continues  to have issues with afib he would be amenable to repeat ablation.  Check bmet/cbc.  Continue Eliquis  5 mg BID Continue diltiazem  30 mg PRN q 4 hours for heart racing  Secondary Hypercoagulable State (ICD10:  D68.69) The patient is at significant risk for stroke/thromboembolism based upon his CHA2DS2-VASc Score of 3.  Continue Apixaban  (Eliquis ). No bleeding issues.   HTN Stable on current regimen Currently holding BB until back in SR.  CAD No anginal symptoms Followed by Dr Perla   Follow up in the AF clinic post DCCV.    Informed Consent   Shared Decision Making/Informed Consent The risks (stroke, cardiac arrhythmias rarely resulting in the need for a temporary or permanent pacemaker, skin irritation or burns and complications associated with conscious sedation including aspiration, arrhythmia, respiratory failure and death), benefits (restoration of normal sinus rhythm) and alternatives of a direct current cardioversion were explained in detail to Mr. Southwood and he agrees to proceed.        [facility] [address] [place], [place] [address] [phone]

## 2024-04-26 NOTE — Telephone Encounter (Signed)
 Patient called back to clinic   This RN explained that patient was in Afib and that was reason for initial call  Patient's spouse notified this RN that patient was symptomatic yesterday (lethargic, dizzy), and stated that she has held the patient's last 2 doses of Toprol  due to low blood pressures in patient  Spouse stated that patient was feeling better today and that his smart watch was saying he was back in sinus rhythm  This RN requested a manual transmission to confirm this  Transmission received and presenting EGM was c/w Atrial Fibrillation with controlled ventricular rates  AF burden 100%  This RN recommended getting patient scheduled in Afib clinic to help manage patient's Afib  Patient and spouse both agreeable to this idea  Messaged AF clinic schedulers to call and assist patient with getting appointment in clinic  Patient has PRN Diltizem ordered for Afib and patient's spouse mentioned she will initiate a dose of it today to see if he is able to come out of Afib  Routing to providers for awareness

## 2024-04-26 NOTE — Patient Instructions (Addendum)
 Send transmission on Monday to confirm still in afib   Hold metoprolol  until back in normal rhythm then may resume    Cardioversion scheduled for: Tuesday, October 21st   - Arrive at the [facility] ([address]  and check in with ADMITTING at 8:30 AM   - Do not eat or drink anything after midnight the night prior to your procedure.   - Take all your morning medication (except diabetic medications) with a sip of water prior to arrival.  - Do NOT miss any doses of your blood thinner - if you should miss a dose or take a dose more than 4 hours late -- please notify our office immediately.  - You will not be able to drive home after your procedure. Please ensure you have a responsible adult to drive you home. You will need someone with you for 24 hours post procedure.     - Expect to be in the procedural area approximately 2 hours.   - If you feel as if you go back into normal rhythm prior to scheduled cardioversion, please notify our office immediately.   If your procedure is canceled in the cardioversion suite you will be charged a cancellation fee.

## 2024-04-27 ENCOUNTER — Ambulatory Visit (HOSPITAL_COMMUNITY): Payer: Self-pay | Admitting: Physician Assistant

## 2024-04-27 LAB — CBC
Hematocrit: 42.3 % (ref 37.5–51.0)
Hemoglobin: 14.2 g/dL (ref 13.0–17.7)
MCH: 33.6 pg — ABNORMAL HIGH (ref 26.6–33.0)
MCHC: 33.6 g/dL (ref 31.5–35.7)
MCV: 100 fL — ABNORMAL HIGH (ref 79–97)
Platelets: 151 x10E3/uL (ref 150–450)
RBC: 4.22 x10E6/uL (ref 4.14–5.80)
RDW: 12.7 % (ref 11.6–15.4)
WBC: 4.4 x10E3/uL (ref 3.4–10.8)

## 2024-04-27 LAB — BASIC METABOLIC PANEL WITH GFR
BUN/Creatinine Ratio: 16 (ref 10–24)
BUN: 14 mg/dL (ref 8–27)
CO2: 25 mmol/L (ref 20–29)
Calcium: 9.1 mg/dL (ref 8.6–10.2)
Chloride: 102 mmol/L (ref 96–106)
Creatinine, Ser: 0.85 mg/dL (ref 0.76–1.27)
Glucose: 90 mg/dL (ref 70–99)
Potassium: 4.9 mmol/L (ref 3.5–5.2)
Sodium: 139 mmol/L (ref 134–144)
eGFR: 87 mL/min/1.73 (ref >59)

## 2024-04-27 NOTE — Telephone Encounter (Signed)
 Patient was seen in AF clinic yesterday 04/26/24 @ 2:30.

## 2024-04-27 NOTE — Telephone Encounter (Signed)
Attempted to contact patient. No answer, left message to call back

## 2024-04-28 ENCOUNTER — Telehealth: Payer: Self-pay | Admitting: Pharmacy Technician

## 2024-04-28 ENCOUNTER — Other Ambulatory Visit (HOSPITAL_COMMUNITY): Payer: Self-pay

## 2024-04-28 NOTE — Telephone Encounter (Signed)
 Pharmacy Patient Advocate Encounter  Received notification from [employer] that Prior Authorization for repatha  has been APPROVED from 03/29/24 to 04/28/25   PA #/Case ID/Reference #: [id number]

## 2024-04-28 NOTE — Telephone Encounter (Addendum)
 Pharmacy Patient Advocate Encounter   Received notification from CoverMyMeds that prior authorization for repatha  is required/requested.   Insurance verification completed.   The patient is insured through [employer].   Per test claim: PA required; PA submitted to above mentioned insurance via Latent Key/confirmation #/EOC [id number] Status is pending

## 2024-05-02 ENCOUNTER — Telehealth: Payer: Self-pay

## 2024-05-02 MED ORDER — METOPROLOL SUCCINATE ER 25 MG PO TB24: 12.5000 mg | ORAL_TABLET | Freq: Every day | ORAL | Status: AC

## 2024-05-02 NOTE — Telephone Encounter (Signed)
 SABRA

## 2024-05-02 NOTE — Telephone Encounter (Signed)
 Pt called in stating he is sending in a transmission to see if he is still in afib and would like a call back once received

## 2024-05-02 NOTE — Telephone Encounter (Signed)
 Per Daril Kicks PA will cancel cardioversion with return to NSR. Resume metoprolol  and use cardizem  PRN only.  Appt for 3 months made. Pt to call if issues arise prior to follow up. Pt in agreement with plan.

## 2024-05-02 NOTE — Telephone Encounter (Addendum)
 Called and spoke with patient and informed him that he is not in Afib anymore   Patient's spouse wants to know when patient can restart Toprol  and stop Cardizem  since he is no longer in Afib  Will route to MD for awareness of patient being out of Afib and advise on medication

## 2024-05-03 ENCOUNTER — Encounter (HOSPITAL_COMMUNITY): Admission: RE | Payer: Self-pay | Source: Home / Self Care

## 2024-05-03 ENCOUNTER — Ambulatory Visit (HOSPITAL_COMMUNITY): Admission: RE | Admit: 2024-05-03 | Source: Home / Self Care | Admitting: Cardiology

## 2024-05-03 DIAGNOSIS — I4819 Other persistent atrial fibrillation: Secondary | ICD-10-CM

## 2024-05-03 SURGERY — CARDIOVERSION (CATH LAB)
Anesthesia: Monitor Anesthesia Care

## 2024-05-16 ENCOUNTER — Ambulatory Visit (HOSPITAL_COMMUNITY): Admitting: Physician Assistant

## 2024-05-17 ENCOUNTER — Ambulatory Visit (HOSPITAL_COMMUNITY): Admitting: Physician Assistant

## 2024-06-23 ENCOUNTER — Ambulatory Visit: Payer: Self-pay

## 2024-06-23 DIAGNOSIS — I4819 Other persistent atrial fibrillation: Secondary | ICD-10-CM

## 2024-06-24 LAB — CUP PACEART REMOTE DEVICE CHECK
Battery Remaining Longevity: 138 mo
Battery Voltage: 3.03 V
Brady Statistic AP VP Percent: 45.71 %
Brady Statistic AP VS Percent: 0 %
Brady Statistic AS VP Percent: 54.28 %
Brady Statistic AS VS Percent: 0.01 %
Brady Statistic RA Percent Paced: 45.52 %
Brady Statistic RV Percent Paced: 99.99 %
Date Time Interrogation Session: 20251210231918
Implantable Pulse Generator Implant Date: 20240311
Lead Channel Impedance Value: 285 Ohm
Lead Channel Impedance Value: 380 Ohm
Lead Channel Impedance Value: 456 Ohm
Lead Channel Impedance Value: 608 Ohm
Lead Channel Pacing Threshold Amplitude: 0.5 V
Lead Channel Pacing Threshold Amplitude: 0.875 V
Lead Channel Pacing Threshold Pulse Width: 0.4 ms
Lead Channel Pacing Threshold Pulse Width: 0.4 ms
Lead Channel Sensing Intrinsic Amplitude: 0.75 mV
Lead Channel Sensing Intrinsic Amplitude: 0.75 mV
Lead Channel Sensing Intrinsic Amplitude: 31.625 mV
Lead Channel Sensing Intrinsic Amplitude: 31.625 mV
Lead Channel Setting Pacing Amplitude: 1 V
Lead Channel Setting Pacing Amplitude: 2 V
Lead Channel Setting Pacing Pulse Width: 0.4 ms
Lead Channel Setting Sensing Sensitivity: 1.2 mV
Zone Setting Status: 755011
Zone Setting Status: 755011

## 2024-06-30 NOTE — Progress Notes (Signed)
 Remote PPM Transmission

## 2024-07-01 ENCOUNTER — Ambulatory Visit: Payer: Self-pay | Admitting: Cardiology

## 2024-07-11 ENCOUNTER — Other Ambulatory Visit: Payer: Self-pay

## 2024-07-11 DIAGNOSIS — I480 Paroxysmal atrial fibrillation: Secondary | ICD-10-CM

## 2024-07-11 MED ORDER — APIXABAN 5 MG PO TABS: 5.0000 mg | ORAL_TABLET | Freq: Two times a day (BID) | ORAL | 1 refills | Status: AC

## 2024-07-11 NOTE — Telephone Encounter (Signed)
 Prescription refill request for Eliquis  received. Indication: afib  Last office visit: 02/22/2024 Scr:  0.85, 04/26/2024 Age: 83 yo  Weight: 75.2 kg   Refill sent.

## 2024-07-20 ENCOUNTER — Ambulatory Visit (HOSPITAL_COMMUNITY)
Admission: RE | Admit: 2024-07-20 | Discharge: 2024-07-20 | Disposition: A | Discharge status: Home / Self Care | Source: Ambulatory Visit | Attending: Physician Assistant | Admitting: Physician Assistant

## 2024-07-20 VITALS — BP 112/80 | HR 55 | Ht 73.0 in | Wt 167.6 lb

## 2024-07-20 DIAGNOSIS — I4891 Unspecified atrial fibrillation: Secondary | ICD-10-CM | POA: Diagnosis present

## 2024-07-20 DIAGNOSIS — D6869 Other thrombophilia: Secondary | ICD-10-CM | POA: Insufficient documentation

## 2024-07-20 DIAGNOSIS — I480 Paroxysmal atrial fibrillation: Secondary | ICD-10-CM | POA: Insufficient documentation

## 2024-07-20 NOTE — Progress Notes (Signed)
 "   Primary Care Physician: Todd Lynwood FALCON, MD Primary Cardiologist: Todd Gollan, MD Electrophysiologist: Todd ONEIDA HOLTS, MD  Referring Physician: Device Clinic    Todd Lucas is a 84 y.o. male with a history of bicuspid AV, dilated aortic root, HTN, heart block s/p PPM, atrial fibrillation who presents for follow up in the [facility].  The patient was initially diagnosed with atrial fibrillation remotely and historically had a low burden. Patient is on Eliquis  for stroke prevention. The device clinic received an alert earlier today for an ongoing afib episode starting 04/25/24. He spontaneously converted and did not require DCCV.    Patient returns for follow up for atrial fibrillation. He remains in SR today and feels well. He did note a brief episode of afib around Christmas which lasted only a few minutes. No bleeding issues on anticoagulation.   Today, he  denies symptoms of palpitations, chest pain, shortness of breath, orthopnea, PND, lower extremity edema, dizziness, presyncope, syncope, snoring, daytime somnolence, bleeding, or neurologic sequela. The patient is tolerating medications without difficulties and is otherwise without complaint today.    Atrial Fibrillation Risk Factors:  he does have symptoms or diagnosis of sleep apnea. Did not tolerate CPAP. he does not have a history of rheumatic fever.   Atrial Fibrillation Management history:  Previous antiarrhythmic drugs: none Previous cardioversions: none Previous ablations: 2014 cryoablation Anticoagulation history: Eliquis   ROS- All systems are reviewed and negative except as per the HPI above.  Past Medical History:  Diagnosis Date   Acquired dilation of ascending aorta and aortic root    a. 02/2022 Echo: Asc Ao 42mm; b. 12/2022 Cardiac CT: Asc Ao 4.7cm; c. 01/2023 Myoview : 4.8cm Asc Ao noted on CT imaging.   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 Nodular. EDC   Coronary artery calcification seen on CT scan    a. 12/2022 Cardiac CT: Ca2+ = 5137 (95th%'ile). CAC > 300 in LAD/LCX/RCA; b. 01/2023 MV: small apical septal reversible defect - likely artifact due to RV pacing. EF>65%. Cor Ca2+. Asc Ao 4.8cm.   Heart block    a. prior PPM placement-->gen change 09/2022 (MDT Azure XT DR MRI SureScan, ser # [id number]).   Heart murmur    Hx of basal cell carcinoma 05/26/2018   R upper arm. Excised 07/15/2018, margins free.   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 s/p PVI in 2014.   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     amoxicillin (AMOXIL) 500 MG tablet Take by mouth. Take 4 tablets an hour before prior to dental procedures     apixaban  (ELIQUIS ) 5 MG TABS tablet Take 1 tablet (5 mg total) by mouth 2 (two) times daily. 180 tablet 1   cyclobenzaprine (FLEXERIL) 5 MG tablet Take 5 mg by mouth 2 (two) times daily. (Patient taking differently: Take 5 mg by mouth as needed for muscle spasms.)     diltiazem  (CARDIZEM ) 30 MG tablet Take 1 tablet (30 mg total)  by mouth 2 (two) times daily as needed (afib). 30 tablet 1   Evolocumab  (REPATHA  SURECLICK) 140 MG/ML SOAJ Inject 140 mg into the skin every 14 (fourteen) days. 6 mL 3   levothyroxine (SYNTHROID) 88 MCG tablet Take 88 mcg by mouth every morning.     methocarbamol (ROBAXIN) 500 MG tablet Take 500-1,000 mg by mouth every 6 (six) hours as needed for muscle spasms.     metoprolol  succinate (TOPROL -XL) 25 MG 24 hr tablet Take 0.5 tablets (12.5 mg total) by mouth daily.     Polyethyl Glycol-Propyl  Glycol (SYSTANE ULTRA) 0.4-0.3 % SOLN Place 1 drop into both eyes daily as needed (dry eyes).     rosuvastatin  (CRESTOR ) 10 MG tablet Take 1 tablet (10 mg total) by mouth daily. 90 tablet 3   zolpidem (AMBIEN) 10 MG tablet Take 10 mg by mouth at bedtime as needed for sleep.     No current facility-administered medications for this encounter.    Physical Exam: BP 112/80   Pulse (!) 55   Ht 6' 1 (1.854 m)   Wt 76 kg   BMI 22.11 kg/m   GEN: Well nourished, well developed in no acute distress CARDIAC: Regular rate and rhythm, no murmurs, rubs, gallops RESPIRATORY:  Clear to auscultation without rales, wheezing or rhonchi  ABDOMEN: Soft, non-tender, non-distended EXTREMITIES:  No edema; No deformity    Wt Readings from Last 3 Encounters:  07/20/24 76 kg  04/26/24 75.2 kg  02/22/24 73.6 kg     EKG Interpretation Date/Time:  Wednesday July 20 2024 14:39:10 EST Ventricular Rate:  55 PR Interval:  120 QRS Duration:  186 QT Interval:  488 QTC Calculation: 466 R Axis:   -86  Text Interpretation: Atrial-sensed ventricular-paced rhythm Abnormal ECG When compared with ECG of 26-Apr-2024 14:34, Atrial-sensed ventricular-paced rhythm has replaced VENTRICULAR PACED RHYTHM Confirmed by [name] ([id number]) on 07/20/2024 2:48:01 PM    Echo 02/25/22 demonstrated   1. Left ventricular ejection fraction, by estimation, is 60 to 65%. The  left ventricle has normal function. The left ventricle has no regional  wall motion abnormalities. There is mild left ventricular hypertrophy.  Left ventricular diastolic parameters are consistent with Grade II diastolic dysfunction (pseudonormalization).   2. Right ventricular systolic function is normal. The right ventricular  size is moderately enlarged. Tricuspid regurgitation signal is inadequate  for assessing PA pressure.   3. Left atrial size was mildly dilated.   4. The mitral valve is normal in structure. Mild mitral valve  regurgitation. No  evidence of mitral stenosis.   5. The aortic valve is bicuspid. There is mild calcification of the  aortic valve. Aortic valve regurgitation is not visualized. Aortic valve  sclerosis is present, with no evidence of aortic valve stenosis.   6. There is mild dilatation of the ascending aorta, measuring 42 mm.   7. The inferior vena cava is normal in size with greater than 50%  respiratory variability, suggesting right atrial pressure of 3 mmHg.    CHA2DS2-VASc Score = 3  The patient's score is based upon: CHF History: 0 HTN History: 1 Diabetes History: 0 Stroke History: 0 Vascular Disease History: 0 Age Score: 2 Gender Score: 0       ASSESSMENT AND PLAN: Paroxysmal Atrial Fibrillation (ICD10:  I48.0) The patient's CHA2DS2-VASc score is 3, indicating a 3.2% annual risk of stroke.   S/p cryoablation 2014 Patient appears to be maintaining SR with low burden of afib. He further rhythm control is needed, he  would be agreeable to repeat ablation.  Continue Eliquis  5 mg BID Continue Toprol  12.5 mg daily Continue diltiazem  30 mg PRN q 4 hours for heart racing.   Secondary Hypercoagulable State (ICD10:  D68.69) The patient is at significant risk for stroke/thromboembolism based upon his CHA2DS2-VASc Score of 3.  Continue Apixaban  (Eliquis ). No bleeding issues.   Symptomatic bradycardia S/p PPM, followed by Dr Kennyth  HTN Stable on current regimen  CAD No anginal symptoms Followed by Dr Perla   Follow up with Suzann Riddle per recall.    [facility] [address] [place], [place] [address] [phone] "
